# Patient Record
Sex: Female | Born: 1990
Health system: Southern US, Community
[De-identification: ages and names within clinical notes are randomized; demographics above are authoritative.]

## PROBLEM LIST (undated history)

## (undated) DIAGNOSIS — F329 Major depressive disorder, single episode, unspecified: Secondary | ICD-10-CM

## (undated) DIAGNOSIS — G8929 Other chronic pain: Secondary | ICD-10-CM

## (undated) DIAGNOSIS — F419 Anxiety disorder, unspecified: Secondary | ICD-10-CM

## (undated) DIAGNOSIS — E079 Disorder of thyroid, unspecified: Secondary | ICD-10-CM

## (undated) DIAGNOSIS — F32A Depression, unspecified: Secondary | ICD-10-CM

## (undated) DIAGNOSIS — R51 Headache: Secondary | ICD-10-CM

## (undated) HISTORY — PX: TONSILLECTOMY: SUR1361

## (undated) HISTORY — DX: Depression, unspecified: F32.A

## (undated) HISTORY — DX: Disorder of thyroid, unspecified: E07.9

## (undated) HISTORY — DX: Anxiety disorder, unspecified: F41.9

## (undated) HISTORY — DX: Major depressive disorder, single episode, unspecified: F32.9

## (undated) HISTORY — DX: Headache: R51

## (undated) HISTORY — DX: Headache, unspecified: G89.29

---

## 2012-10-21 ENCOUNTER — Ambulatory Visit (HOSPITAL_COMMUNITY)
Admission: RE | Admit: 2012-10-21 | Discharge: 2012-10-21 | Disposition: A | Discharge status: Home / Self Care | Payer: BC Managed Care – PPO | Source: Ambulatory Visit | Attending: Internal Medicine | Admitting: Internal Medicine

## 2012-10-21 ENCOUNTER — Other Ambulatory Visit (HOSPITAL_COMMUNITY): Payer: Self-pay | Admitting: Internal Medicine

## 2012-10-21 DIAGNOSIS — S139XXA Sprain of joints and ligaments of unspecified parts of neck, initial encounter: Secondary | ICD-10-CM

## 2012-10-21 DIAGNOSIS — R51 Headache: Secondary | ICD-10-CM

## 2012-11-21 ENCOUNTER — Other Ambulatory Visit: Payer: Self-pay | Admitting: Internal Medicine

## 2012-11-21 LAB — BASIC METABOLIC PANEL WITH GFR
BUN: 12 mg/dL (ref 6–23)
CO2: 26 mEq/L (ref 19–32)
Calcium: 9.5 mg/dL (ref 8.4–10.5)
Chloride: 103 mEq/L (ref 96–112)
GFR, Est Non African American: >89 mL/min
Glucose, Bld: 85 mg/dL (ref 70–99)
Potassium: 4 mEq/L (ref 3.5–5.3)

## 2012-11-21 LAB — CBC WITH DIFFERENTIAL/PLATELET
Basophils Absolute: 0 10*3/uL (ref 0.0–0.1)
Basophils Relative: 0 % (ref 0–1)
HCT: 43 % (ref 36.0–46.0)
Hemoglobin: 15.2 g/dL — ABNORMAL HIGH (ref 12.0–15.0)
MCHC: 35.3 g/dL (ref 30.0–36.0)
Monocytes Absolute: 0.4 10*3/uL (ref 0.1–1.0)
Monocytes Relative: 5 % (ref 3–12)
Neutro Abs: 4.8 10*3/uL (ref 1.7–7.7)
RDW: 12.9 % (ref 11.5–15.5)

## 2012-11-22 LAB — VITAMIN D 25 HYDROXY (VIT D DEFICIENCY, FRACTURES): Vit D, 25-Hydroxy: 81 ng/mL (ref 30–89)

## 2012-11-25 ENCOUNTER — Other Ambulatory Visit: Payer: Self-pay | Admitting: Physician Assistant

## 2012-11-25 DIAGNOSIS — E049 Nontoxic goiter, unspecified: Secondary | ICD-10-CM

## 2012-11-25 LAB — FOLATE RBC: RBC Folate: 674 ng/mL (ref >=366)

## 2012-11-27 ENCOUNTER — Other Ambulatory Visit: Payer: BC Managed Care – PPO

## 2012-12-08 ENCOUNTER — Other Ambulatory Visit: Payer: BC Managed Care – PPO

## 2012-12-11 ENCOUNTER — Ambulatory Visit
Admission: RE | Admit: 2012-12-11 | Discharge: 2012-12-11 | Disposition: A | Discharge status: Home / Self Care | Payer: BC Managed Care – PPO | Source: Ambulatory Visit | Attending: Physician Assistant | Admitting: Physician Assistant

## 2012-12-11 DIAGNOSIS — E049 Nontoxic goiter, unspecified: Secondary | ICD-10-CM

## 2013-01-19 ENCOUNTER — Ambulatory Visit: Payer: Self-pay

## 2013-01-20 ENCOUNTER — Ambulatory Visit (INDEPENDENT_AMBULATORY_CARE_PROVIDER_SITE_OTHER): Payer: BC Managed Care – PPO | Admitting: *Deleted

## 2013-01-20 DIAGNOSIS — Z23 Encounter for immunization: Secondary | ICD-10-CM

## 2013-01-20 NOTE — Progress Notes (Signed)
Patient ID: Adriana Smith, female   DOB: 12-01-1990, 22 y.o.   MRN: [id number] Patient presented for #2 Gardasil vaccination today.  Right deltoid cleaned with alcohol and administered 0.67ml prefilled 1 dose vial HPV Quadrivalent Gardasil.  Patient tolerated well and will schedule #3 dose at checkout today.

## 2013-04-15 ENCOUNTER — Ambulatory Visit (INDEPENDENT_AMBULATORY_CARE_PROVIDER_SITE_OTHER): Payer: BC Managed Care – PPO | Admitting: Family Medicine

## 2013-04-15 VITALS — BP 110/62 | HR 65 | Temp 98.1°F | Resp 16 | Ht 62.0 in | Wt 111.4 lb

## 2013-04-15 DIAGNOSIS — R197 Diarrhea, unspecified: Secondary | ICD-10-CM

## 2013-04-15 DIAGNOSIS — R1032 Left lower quadrant pain: Secondary | ICD-10-CM

## 2013-04-15 LAB — POCT CBC
GRANULOCYTE PERCENT: 63.8 % (ref 37–80)
HEMATOCRIT: 37 % — AB (ref 37.7–47.9)
Hemoglobin: 12.1 g/dL — AB (ref 12.2–16.2)
LYMPH, POC: 1.8 (ref 0.6–3.4)
MCH, POC: 30.2 pg (ref 27–31.2)
MCHC: 32.7 g/dL (ref 31.8–35.4)
MCV: 92.2 fL (ref 80–97)
MID (CBC): 0.4 (ref 0–0.9)
MPV: 9.7 fL (ref 0–99.8)
PLATELET COUNT, POC: 255 10*3/uL (ref 142–424)
POC GRANULOCYTE: 3.8 (ref 2–6.9)
POC LYMPH %: 30 % (ref 10–50)
POC MID %: 6.2 %M (ref 0–12)
RBC: 4.01 M/uL — AB (ref 4.04–5.48)
RDW, POC: 13.3 %
WBC: 5.9 10*3/uL (ref 4.6–10.2)

## 2013-04-15 LAB — POCT SEDIMENTATION RATE: POCT SED RATE: 6 mm/hr (ref 0–22)

## 2013-04-15 NOTE — Patient Instructions (Signed)
Diarrhea Diarrhea is frequent loose and watery bowel movements. It can cause you to feel weak and dehydrated. Dehydration can cause you to become tired and thirsty, have a dry mouth, and have decreased urination that often is dark yellow. Diarrhea is a sign of another problem, most often an infection that will not last long. In most cases, diarrhea typically lasts 2 3 days. However, it can last longer if it is a sign of something more serious. It is important to treat your diarrhea as directed by your caregive to lessen or prevent future episodes of diarrhea. CAUSES  Some common causes include:  Gastrointestinal infections caused by viruses, bacteria, or parasites.  Food poisoning or food allergies.  Certain medicines, such as antibiotics, chemotherapy, and laxatives.  Artificial sweeteners and fructose.  Digestive disorders. HOME CARE INSTRUCTIONS  Ensure adequate fluid intake (hydration): have 1 cup (8 oz) of fluid for each diarrhea episode. Avoid fluids that contain simple sugars or sports drinks, fruit juices, whole milk products, and sodas. Your urine should be clear or pale yellow if you are drinking enough fluids. Hydrate with an oral rehydration solution that you can purchase at pharmacies, retail stores, and online. You can prepare an oral rehydration solution at home by mixing the following ingredients together:    tsp table salt.   tsp baking soda.   tsp salt substitute containing potassium chloride.  1  tablespoons sugar.  1 L (34 oz) of water.  Certain foods and beverages may increase the speed at which food moves through the gastrointestinal (GI) tract. These foods and beverages should be avoided and include:  Caffeinated and alcoholic beverages.  High-fiber foods, such as raw fruits and vegetables, nuts, seeds, and whole grain breads and cereals.  Foods and beverages sweetened with sugar alcohols, such as xylitol, sorbitol, and mannitol.  Some foods may be well  tolerated and may help thicken stool including:  Starchy foods, such as rice, toast, pasta, low-sugar cereal, oatmeal, grits, baked potatoes, crackers, and bagels.  Bananas.  Applesauce.  Add probiotic-rich foods to help increase healthy bacteria in the GI tract, such as yogurt and fermented milk products.  Wash your hands well after each diarrhea episode.  Only take over-the-counter or prescription medicines as directed by your caregiver.  Take a warm bath to relieve any burning or pain from frequent diarrhea episodes. SEEK IMMEDIATE MEDICAL CARE IF:   You are unable to keep fluids down.  You have persistent vomiting.  You have blood in your stool, or your stools are black and tarry.  You do not urinate in 6 8 hours, or there is only a small amount of very dark urine.  You have abdominal pain that increases or localizes.  You have weakness, dizziness, confusion, or lightheadedness.  You have a severe headache.  Your diarrhea gets worse or does not get better.  You have a fever or persistent symptoms for more than 2 3 days.  You have a fever and your symptoms suddenly get worse. MAKE SURE YOU:   Understand these instructions.  Will watch your condition.  Will get help right away if you are not doing well or get worse. Document Released: 12/29/2001 Document Revised: 12/26/2011 Document Reviewed: 09/16/2011 [employer] Patient Information 2014 [employer].  

## 2013-04-15 NOTE — Progress Notes (Signed)
Subjective:    Patient ID: Adriana Smith, female    DOB: [birth year], 23 y.o.   MRN: [id number]  04/15/2013  Diarrhea, Abdominal Pain and Gardasil   HPI This 23 y.o. female presents for evaluation diarrhea.  Onset of diarrhea three weeks ago.  Had menses; usually has stomach upset with menses.  Had more stomach upset with menses. Has suffered with diarrhea after each meal for three weeks.  Has suffered with lower abdominal pain for years; chronic issue.  Thought abdominal pain was OCP so lowered OCP dose; pain improved but persisted.  Decreased appetite for past three weeks.  Has lost seven pounds since 02/2013.  Really bad headache; constantly fatigued/tired.  Admits to stress; switching jobs.  Pain has worsened with stool changes.  Awoke at 3:00am due to diarrhea; also happened last week.  Chills last night.  No fever/sweats.  No nausea, vomiting. +appetite but early satiety.  Stomach churns all the time.  No antibiotics.  No recent traveling; no camping; +sick contacts at work.  Admissions counselor for high school.  Traveled two weeks ago to [place] and [place].  No new foods while gone.  Having 4 stools per day; formed stool but very loose; non-bloody; +mucous.  No Crohn's disease or Ulcerative Colitis in family hx.  Worried about gluten allergy.  Mother with similar symptoms at this age but she grew out of it; occurred in 53s.  Thyroid abnormality in 10/2012; took B12 supplement with improvement in thyroid levels; no follow-up with that physician.   Review of Systems  Constitutional: Positive for chills, appetite change, fatigue and unexpected weight change. Negative for fever and diaphoresis.  Gastrointestinal: Positive for nausea, abdominal pain and diarrhea. Negative for vomiting, constipation, blood in stool, abdominal distention and anal bleeding.  Genitourinary: Negative for dysuria, urgency and frequency.  Neurological: Positive for headaches. Negative for dizziness and  light-headedness.  Psychiatric/Behavioral: The patient is nervous/anxious.     Past Medical History  Diagnosis Date  . Depression   . Anxiety   . Thyroid disease   . Chronic headaches    No Known Allergies Current Outpatient Prescriptions  Medication Sig Dispense Refill  . norgestrel-ethinyl estradiol (LO/OVRAL,CRYSELLE) 0.3-30 MG-MCG tablet Take 1 tablet by mouth daily.       No current facility-administered medications for this visit.       Objective:    BP 110/62  Pulse 65  Temp(Src) 98.1 F (36.7 C) (Oral)  Resp 16  Ht 5' 2" (1.575 m)  Wt 111 lb 6.4 oz (50.531 kg)  BMI 20.37 kg/m2  SpO2 100%  LMP 04/08/2013 Physical Exam  Constitutional: She is oriented to person, place, and time. She appears well-developed and well-nourished. No distress.  HENT:  Head: Normocephalic and atraumatic.  Right Ear: External ear normal.  Left Ear: External ear normal.  Nose: Nose normal.  Mouth/Throat: Oropharynx is clear and moist.  Eyes: Conjunctivae and EOM are normal. Pupils are equal, round, and reactive to light.  Neck: Normal range of motion. Neck supple. Carotid bruit is not present. No thyromegaly present.  Cardiovascular: Normal rate, regular rhythm, normal heart sounds and intact distal pulses.  Exam reveals no gallop and no friction rub.   No murmur heard. Pulmonary/Chest: Effort normal and breath sounds normal. She has no wheezes. She has no rales.  Abdominal: Soft. Bowel sounds are normal. She exhibits no distension and no mass. There is tenderness. There is no rebound and no guarding.  Diffuse TTP; no g/r.  Lymphadenopathy:  She has no cervical adenopathy.  Neurological: She is alert and oriented to person, place, and time. No cranial nerve deficit.  Skin: Skin is warm and dry. No rash noted. She is not diaphoretic. No erythema. No pallor.  Psychiatric: She has a normal mood and affect. Her behavior is normal.   Results for orders placed in visit on 04/15/13  POCT  CBC      Result Value Ref Range   WBC 5.9  4.6 - 10.2 K/uL   Lymph, poc 1.8  0.6 - 3.4   POC LYMPH PERCENT 30.0  10 - 50 %L   MID (cbc) 0.4  0 - 0.9   POC MID % 6.2  0 - 12 %M   POC Granulocyte 3.8  2 - 6.9   Granulocyte percent 63.8  37 - 80 %G   RBC 4.01 (*) 4.04 - 5.48 M/uL   Hemoglobin 12.1 (*) 12.2 - 16.2 g/dL   HCT, POC 37.0 (*) 37.7 - 47.9 %   MCV 92.2  80 - 97 fL   MCH, POC 30.2  27 - 31.2 pg   MCHC 32.7  31.8 - 35.4 g/dL   RDW, POC 13.3     Platelet Count, POC 255  142 - 424 K/uL   MPV 9.7  0 - 99.8 fL       Assessment & Plan:  Diarrhea - Plan: POCT CBC, POCT SEDIMENTATION RATE, Comprehensive metabolic panel, TSH, Ova and parasite examination, Clostridium difficile EIA, Stool culture  Abdominal pain, left lower quadrant - Plan: POCT CBC, POCT SEDIMENTATION RATE, Comprehensive metabolic panel, TSH, Ova and parasite examination, Clostridium difficile EIA, Stool culture   1. Diarrhea;  New; onset three weeks ago; obtain labs including CBC, CMET, stool culture, C. Difficile, O&P.  Obtain ESR.  Seven pound weight loss with acute illness. 2.  Abdominal pain LLQ and diffuse:  New. Chronic issue for patient with recent worsening with diarrhea.  Concerning for IBS, Celiac disease, Crohn's disease, or UC.  Refer to GI.   Meds ordered this encounter  Medications  . norgestrel-ethinyl estradiol (LO/OVRAL,CRYSELLE) 0.3-30 MG-MCG tablet    Sig: Take 1 tablet by mouth daily.    No Follow-up on file.    Reginia Forts, M.D.  [facility] [address] [place], [place]  [address] [phone] phone [phone] fax

## 2013-04-16 ENCOUNTER — Ambulatory Visit: Payer: Self-pay

## 2013-04-16 LAB — COMPREHENSIVE METABOLIC PANEL
ALK PHOS: 28 U/L — AB (ref 39–117)
ALT: 10 U/L (ref 0–35)
AST: 16 U/L (ref 0–37)
Albumin: 4.5 g/dL (ref 3.5–5.2)
BILIRUBIN TOTAL: 0.4 mg/dL (ref 0.2–1.2)
BUN: 8 mg/dL (ref 6–23)
CO2: 23 meq/L (ref 19–32)
CREATININE: 0.73 mg/dL (ref 0.50–1.10)
Calcium: 8.6 mg/dL (ref 8.4–10.5)
Chloride: 104 mEq/L (ref 96–112)
Glucose, Bld: 72 mg/dL (ref 70–99)
Potassium: 3.9 mEq/L (ref 3.5–5.3)
SODIUM: 137 meq/L (ref 135–145)
Total Protein: 6.9 g/dL (ref 6.0–8.3)

## 2013-04-16 LAB — TSH: TSH: 0.917 u[IU]/mL (ref 0.350–4.500)

## 2013-04-18 LAB — CLOSTRIDIUM DIFFICILE EIA: CDIFTX: NEGATIVE

## 2013-04-21 LAB — STOOL CULTURE

## 2013-05-21 ENCOUNTER — Ambulatory Visit: Payer: Self-pay | Admitting: Physician Assistant

## 2013-05-21 ENCOUNTER — Telehealth: Payer: Self-pay

## 2013-05-21 VITALS — BP 118/60 | HR 64 | Temp 98.4°F | Resp 18 | Ht 62.0 in | Wt 112.0 lb

## 2013-05-21 DIAGNOSIS — N390 Urinary tract infection, site not specified: Secondary | ICD-10-CM

## 2013-05-21 DIAGNOSIS — R300 Dysuria: Secondary | ICD-10-CM

## 2013-05-21 LAB — POCT URINALYSIS DIPSTICK
Bilirubin, UA: NEGATIVE
GLUCOSE UA: NEGATIVE
KETONES UA: NEGATIVE
Nitrite, UA: NEGATIVE
Protein, UA: NEGATIVE
SPEC GRAV UA: 1.015
UROBILINOGEN UA: 0.2
pH, UA: 6.5

## 2013-05-21 LAB — POCT UA - MICROSCOPIC ONLY
CRYSTALS, UR, HPF, POC: NEGATIVE
Casts, Ur, LPF, POC: NEGATIVE
MUCUS UA: NEGATIVE
Yeast, UA: NEGATIVE

## 2013-05-21 MED ORDER — CIPROFLOXACIN HCL 250 MG PO TABS: 250.0000 mg | ORAL_TABLET | Freq: Two times a day (BID) | ORAL | Status: DC

## 2013-05-21 NOTE — Progress Notes (Signed)
   Subjective:    Patient ID: Adriana RimaChelsea Brumbach, female    DOB: [birth year], 23 y.o.   MRN: [id number]  HPI 23 year old female presents for evaluation of 3 day history of dysuria, suprapubic abdominal pain, and hematuria.  States yesterday she noticed that she was "peeing blood." At first she thought it may be her menses starting, but she used a tampon and continued to notice blood in her urine.  Admits to lower abdominal pain and discomfort with urination.  Hx of a UTI in the past hat was "years" ago.  Also complains of slight vaginal discharge but admits this is not different from her "norm."  Has appt with her GYN next month.  No specific concerns about STI's today. Denies fever, chills, nausea, or vomiting.     Review of Systems  Constitutional: Negative for fever and chills.  Gastrointestinal: Positive for abdominal pain. Negative for nausea and vomiting.  Genitourinary: Positive for dysuria, frequency, hematuria and vaginal discharge.  Neurological: Negative for dizziness and headaches.       Objective:   Physical Exam  Constitutional: She is oriented to person, place, and time. She appears well-developed and well-nourished.  HENT:  Head: Normocephalic and atraumatic.  Right Ear: External ear normal.  Left Ear: External ear normal.  Eyes: Conjunctivae are normal.  Neck: Normal range of motion.  Cardiovascular: Normal rate, regular rhythm and normal heart sounds.   Pulmonary/Chest: Effort normal and breath sounds normal.  Abdominal: Soft. Bowel sounds are normal. There is tenderness in the suprapubic area. There is no CVA tenderness.  Neurological: She is alert and oriented to person, place, and time.  Psychiatric: She has a normal mood and affect. Her behavior is normal. Judgment and thought content normal.    Results for orders placed in visit on 05/21/13  POCT URINALYSIS DIPSTICK      Result Value Ref Range   Color, UA yellow     Clarity, UA clear     Glucose, UA neg     Bilirubin, UA neg     Ketones, UA neg     Spec Grav, UA 1.015     Blood, UA trace     pH, UA 6.5     Protein, UA neg     Urobilinogen, UA 0.2     Nitrite, UA neg     Leukocytes, UA Trace    POCT UA - MICROSCOPIC ONLY      Result Value Ref Range   WBC, Ur, HPF, POC 2-4     RBC, urine, microscopic 0-2     Bacteria, U Microscopic trace     Mucus, UA neg     Epithelial cells, urine per micros 1-3     Crystals, Ur, HPF, POC neg     Casts, Ur, LPF, POC neg     Yeast, UA neg           Assessment & Plan:  UTI (urinary tract infection) - Plan: ciprofloxacin (CIPRO) 250 MG tablet, Urine culture  Dysuria - Plan: POCT urinalysis dipstick, POCT UA - Microscopic Only, ciprofloxacin (CIPRO) 250 MG tablet, Urine culture  Urine culture pending Will treat as UTI with Cipro 250 mg bid x 5 days Push fluids Patient declined STI screening today secondary to cost. Encouraged her to f/u here or with GYN if symptoms fail to improve or vaginal sx's worsening.

## 2013-05-21 NOTE — Telephone Encounter (Signed)
Dr. Katrinka BlazingSmith    Patient thinks she has a UTI,  No insurance this week, will have next week.   Also has blood in urine heavy yesterday, not so much today.    [phone]

## 2013-05-21 NOTE — Telephone Encounter (Signed)
Spoke to patient.  She says that she woke up about 1am and she was "peeing blood"  She states that today is not having blood in her urine but is burning while she pees.  She says that she does not have insurance because she has changed jobs.  Advised patient that we do give a discount for patients without insurance.  Told her that she needs to be seen.  She said she will come in after work.

## 2013-05-23 LAB — URINE CULTURE: Colony Count: >=100000

## 2013-05-25 ENCOUNTER — Encounter: Payer: Self-pay | Admitting: Family Medicine

## 2013-05-27 ENCOUNTER — Telehealth: Payer: Self-pay

## 2013-05-27 NOTE — Telephone Encounter (Signed)
Reviewed labs with patient,  patient states understanding.

## 2013-10-08 ENCOUNTER — Encounter: Payer: Self-pay | Admitting: Physician Assistant

## 2013-10-08 DIAGNOSIS — A09 Infectious gastroenteritis and colitis, unspecified: Secondary | ICD-10-CM | POA: Insufficient documentation

## 2014-11-03 IMAGING — CR DG CERVICAL SPINE COMPLETE 4+V
5 series · 5 of 5 positions shown · non-contrast
Comparison: None

CLINICAL DATA: Neck sprain.

CERVICAL SPINE - COMPLETE 4+ VIEW

[view not recorded (1 of 5)]
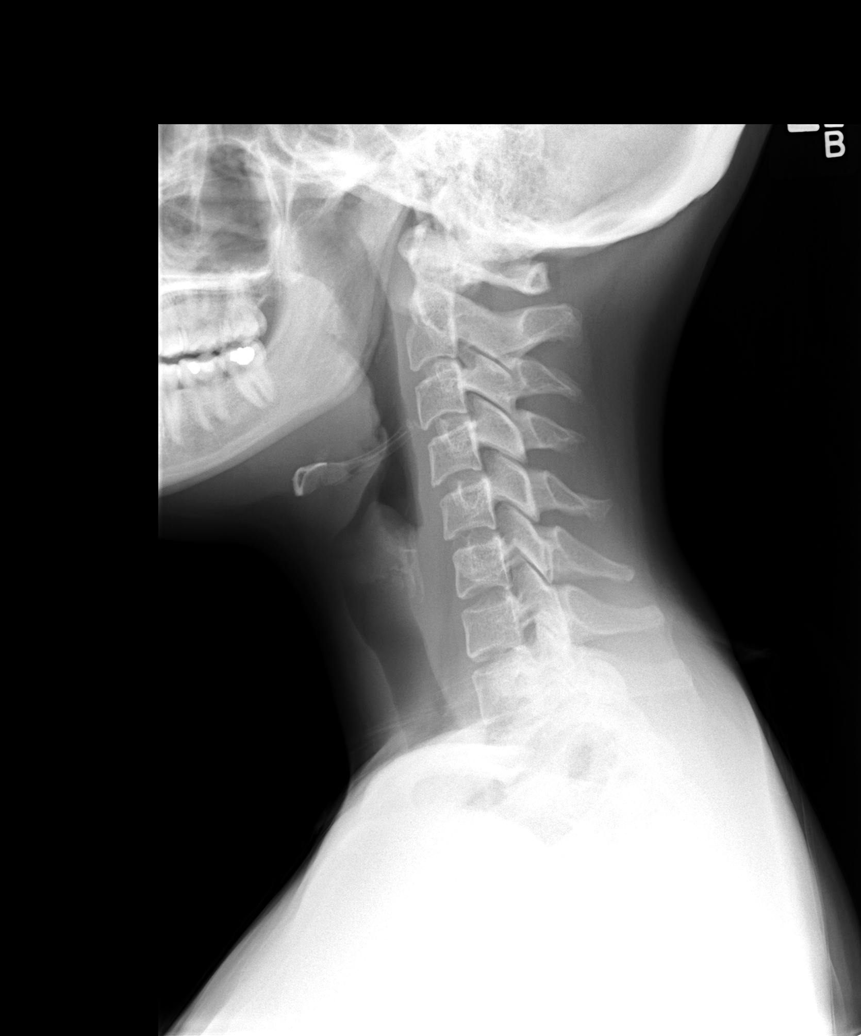

[view not recorded (2 of 5)]
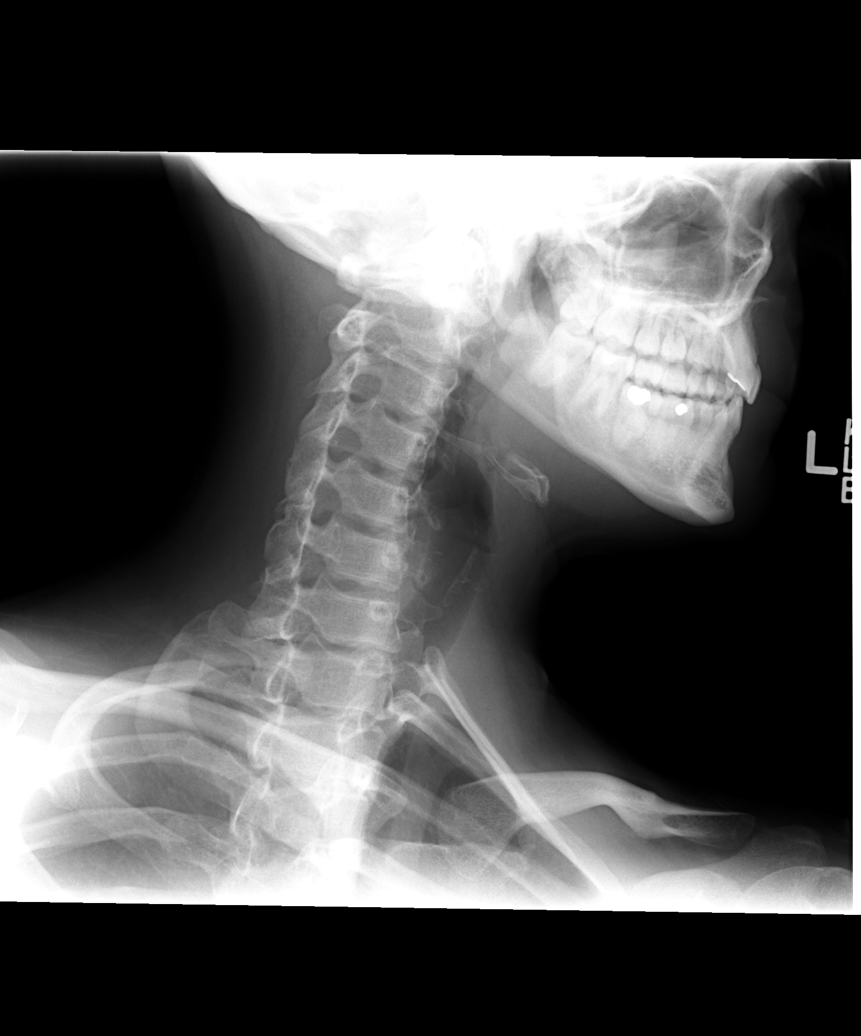

[view not recorded (3 of 5)]
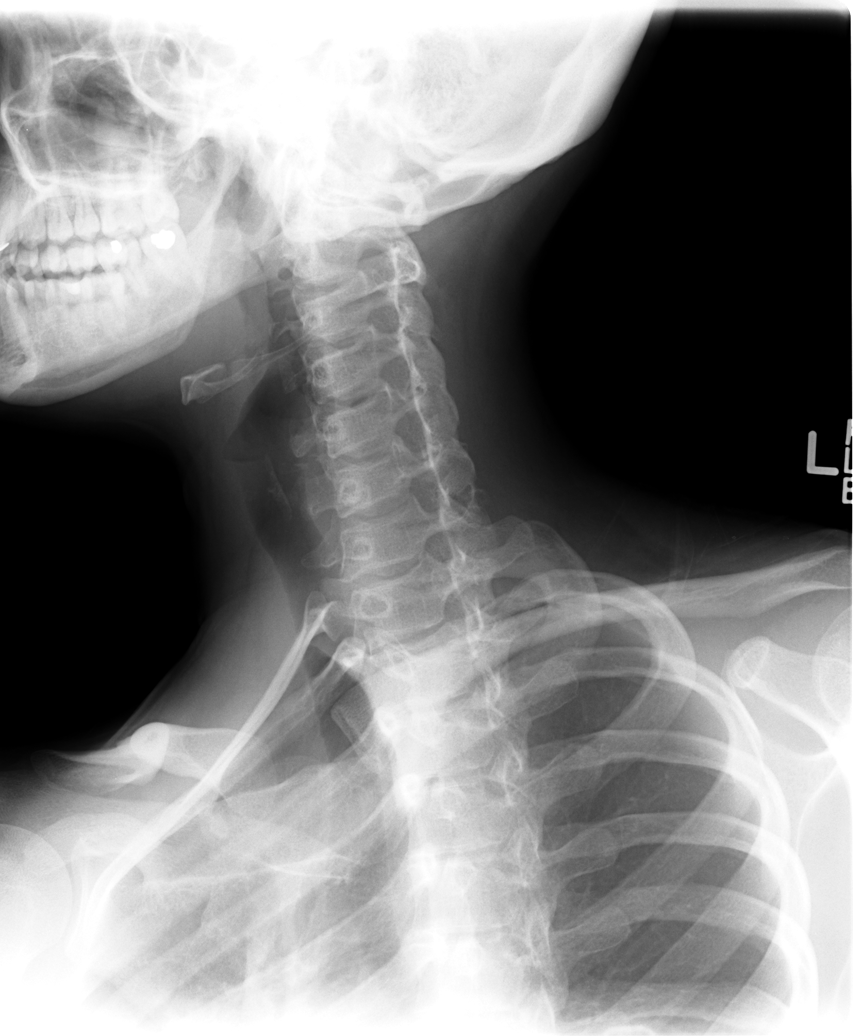

[view not recorded (4 of 5)]
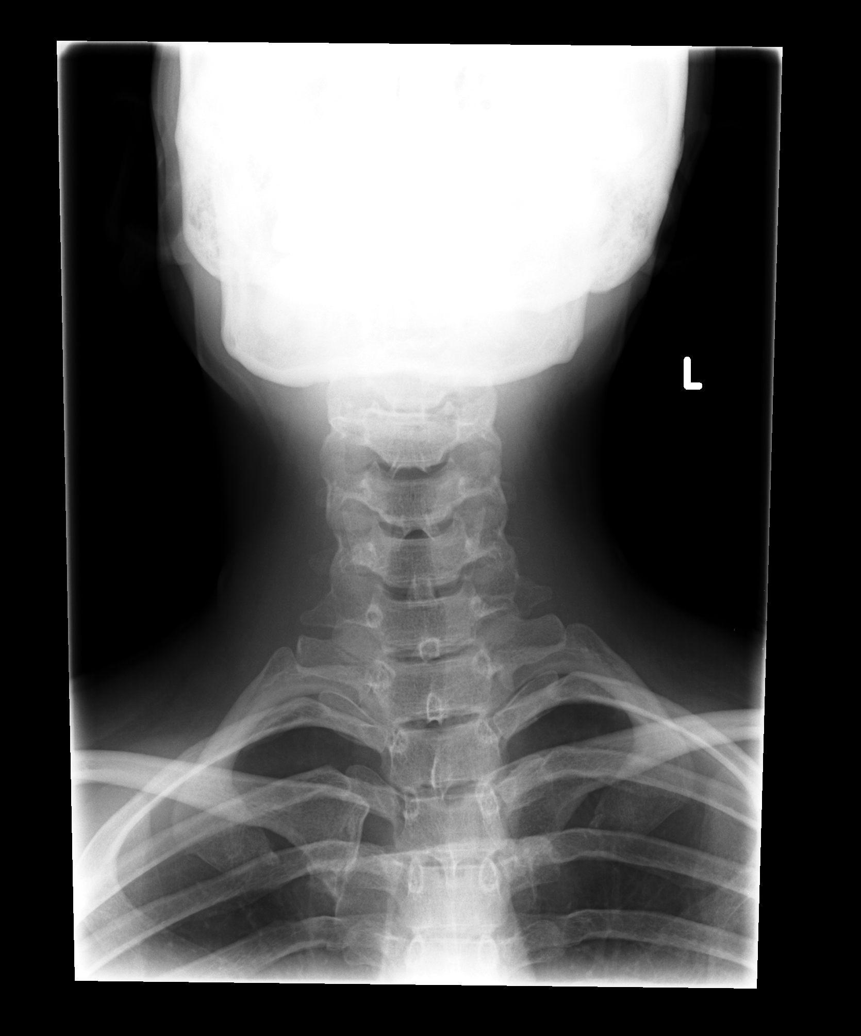

[view not recorded (5 of 5)]
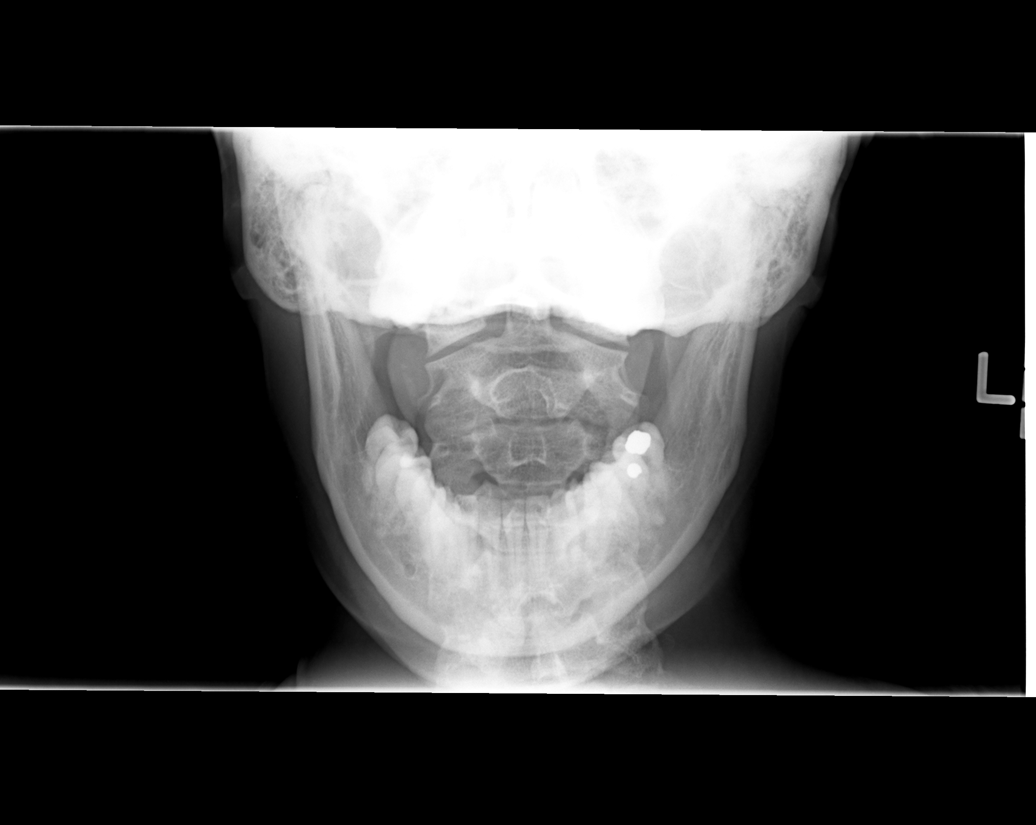

[5 of 5 positions shown; findings below may reference images not displayed]

FINDINGS: Visualization through T1 on lateral view.  Relative
straightening of the normal cervical lordosis.  No evidence for
static listhesis.  Prevertebral soft tissues unremarkable.  Normal
predental space.  No significant osseous neural foraminal
narrowing.  Lateral masses articulate appropriately with the dens.
IMPRESSION: No acute osseous abnormality.

## 2014-11-03 IMAGING — CR DG THORACIC SPINE 3V
2 series · 2 of 2 positions shown · non-contrast
Comparison: None

CLINICAL DATA: Headache.

THORACIC SPINE - 2 VIEW + SWIMMERS

[view not recorded (1 of 2)]
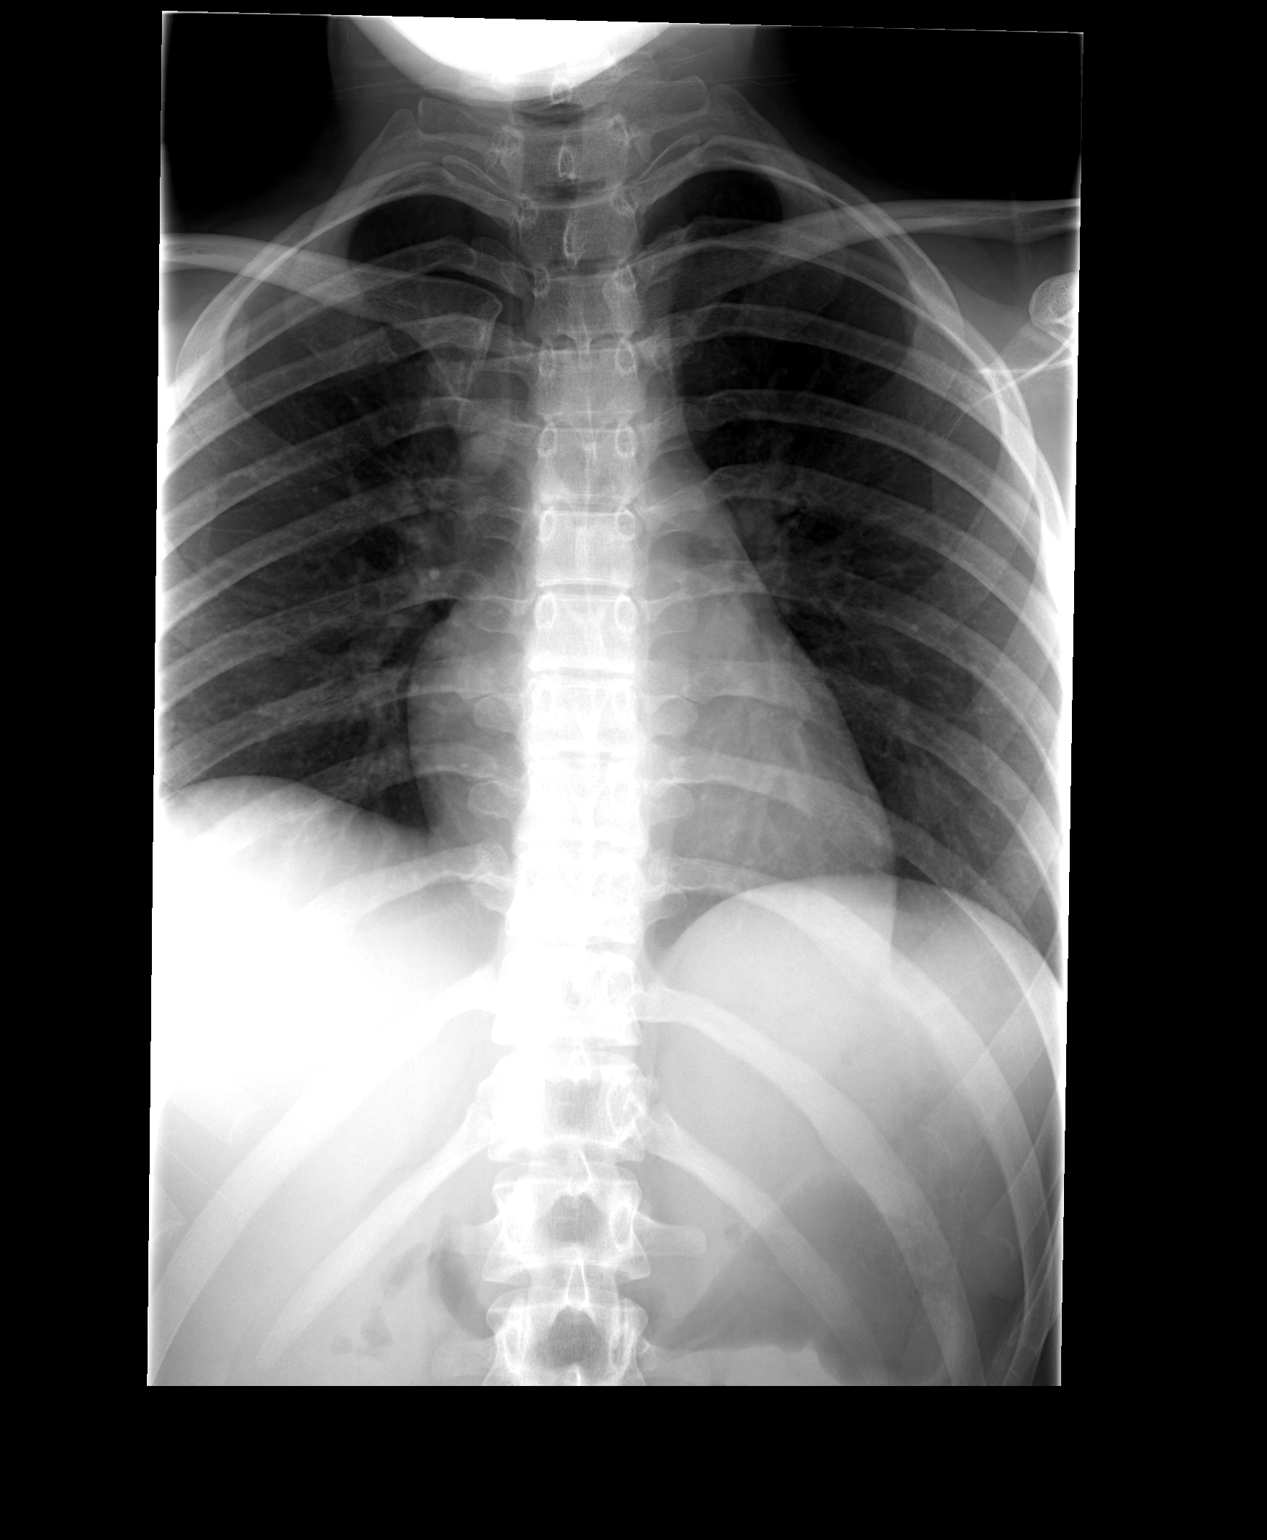

[view not recorded (2 of 2)]
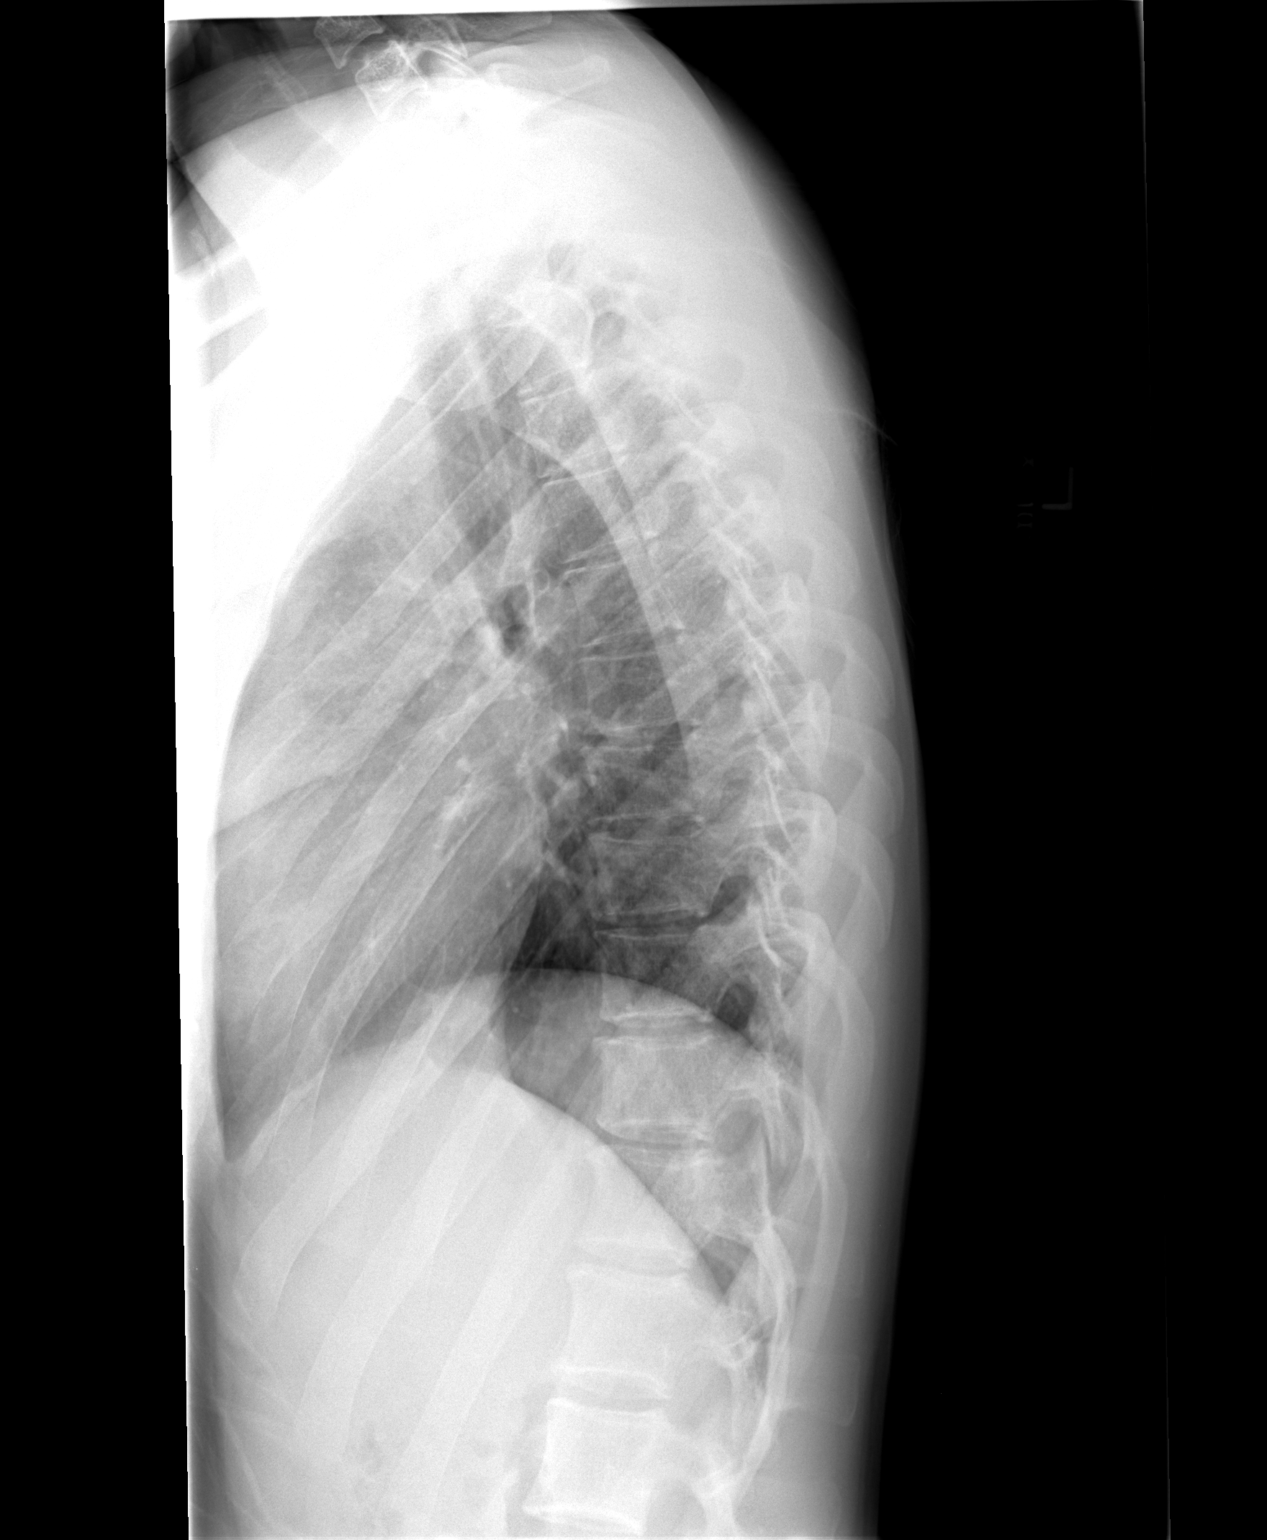

[2 of 2 positions shown; findings below may reference images not displayed]

FINDINGS: Normal anatomic alignment.  No evidence for static
listhesis.  Normal cardiac and mediastinal contours.  Visualized
lungs are clear.
IMPRESSION: No acute osseous abnormality involving the thoracic spine.

## 2015-08-08 DIAGNOSIS — D508 Other iron deficiency anemias: Secondary | ICD-10-CM | POA: Diagnosis not present

## 2015-08-08 DIAGNOSIS — Z0000 Encounter for general adult medical examination without abnormal findings: Secondary | ICD-10-CM | POA: Diagnosis not present

## 2015-12-12 DIAGNOSIS — S161XXA Strain of muscle, fascia and tendon at neck level, initial encounter: Secondary | ICD-10-CM | POA: Diagnosis not present

## 2015-12-12 DIAGNOSIS — J45909 Unspecified asthma, uncomplicated: Secondary | ICD-10-CM | POA: Diagnosis not present

## 2015-12-12 DIAGNOSIS — J069 Acute upper respiratory infection, unspecified: Secondary | ICD-10-CM | POA: Diagnosis not present

## 2016-02-21 DIAGNOSIS — Z01419 Encounter for gynecological examination (general) (routine) without abnormal findings: Secondary | ICD-10-CM | POA: Diagnosis not present

## 2016-02-21 DIAGNOSIS — Z681 Body mass index (BMI) 19 or less, adult: Secondary | ICD-10-CM | POA: Diagnosis not present

## 2016-02-21 DIAGNOSIS — Z124 Encounter for screening for malignant neoplasm of cervix: Secondary | ICD-10-CM | POA: Diagnosis not present

## 2016-04-25 DIAGNOSIS — Z0100 Encounter for examination of eyes and vision without abnormal findings: Secondary | ICD-10-CM | POA: Diagnosis not present

## 2016-06-19 DIAGNOSIS — J387 Other diseases of larynx: Secondary | ICD-10-CM | POA: Diagnosis not present

## 2016-06-19 DIAGNOSIS — J0410 Acute tracheitis without obstruction: Secondary | ICD-10-CM | POA: Diagnosis not present

## 2016-10-22 DIAGNOSIS — S060X9S Concussion with loss of consciousness of unspecified duration, sequela: Secondary | ICD-10-CM | POA: Diagnosis not present

## 2016-10-22 DIAGNOSIS — S0990XA Unspecified injury of head, initial encounter: Secondary | ICD-10-CM | POA: Diagnosis not present

## 2016-10-26 ENCOUNTER — Other Ambulatory Visit: Payer: Self-pay | Admitting: Family Medicine

## 2016-10-26 DIAGNOSIS — S0990XD Unspecified injury of head, subsequent encounter: Secondary | ICD-10-CM

## 2016-10-31 ENCOUNTER — Other Ambulatory Visit: Payer: Self-pay

## 2016-12-10 DIAGNOSIS — Z681 Body mass index (BMI) 19 or less, adult: Secondary | ICD-10-CM | POA: Diagnosis not present

## 2016-12-10 DIAGNOSIS — Z3040 Encounter for surveillance of contraceptives, unspecified: Secondary | ICD-10-CM | POA: Diagnosis not present

## 2017-05-03 DIAGNOSIS — F418 Other specified anxiety disorders: Secondary | ICD-10-CM | POA: Diagnosis not present

## 2017-05-03 DIAGNOSIS — Z0001 Encounter for general adult medical examination with abnormal findings: Secondary | ICD-10-CM | POA: Diagnosis not present

## 2017-05-03 DIAGNOSIS — Z23 Encounter for immunization: Secondary | ICD-10-CM | POA: Diagnosis not present

## 2017-05-03 DIAGNOSIS — M25562 Pain in left knee: Secondary | ICD-10-CM | POA: Diagnosis not present

## 2017-05-03 DIAGNOSIS — M545 Low back pain: Secondary | ICD-10-CM | POA: Diagnosis not present

## 2017-05-03 DIAGNOSIS — Z13220 Encounter for screening for lipoid disorders: Secondary | ICD-10-CM | POA: Diagnosis not present

## 2017-05-09 DIAGNOSIS — Z124 Encounter for screening for malignant neoplasm of cervix: Secondary | ICD-10-CM | POA: Diagnosis not present

## 2017-05-09 DIAGNOSIS — Z3202 Encounter for pregnancy test, result negative: Secondary | ICD-10-CM | POA: Diagnosis not present

## 2017-05-09 DIAGNOSIS — Z01419 Encounter for gynecological examination (general) (routine) without abnormal findings: Secondary | ICD-10-CM | POA: Diagnosis not present

## 2017-05-09 DIAGNOSIS — Z681 Body mass index (BMI) 19 or less, adult: Secondary | ICD-10-CM | POA: Diagnosis not present

## 2017-06-11 DIAGNOSIS — F418 Other specified anxiety disorders: Secondary | ICD-10-CM | POA: Diagnosis not present

## 2017-06-12 DIAGNOSIS — F419 Anxiety disorder, unspecified: Secondary | ICD-10-CM | POA: Diagnosis not present

## 2017-06-12 DIAGNOSIS — F329 Major depressive disorder, single episode, unspecified: Secondary | ICD-10-CM | POA: Diagnosis not present

## 2017-06-13 DIAGNOSIS — F341 Dysthymic disorder: Secondary | ICD-10-CM | POA: Diagnosis not present

## 2017-06-18 DIAGNOSIS — F341 Dysthymic disorder: Secondary | ICD-10-CM | POA: Diagnosis not present

## 2017-06-25 DIAGNOSIS — F341 Dysthymic disorder: Secondary | ICD-10-CM | POA: Diagnosis not present

## 2017-06-26 DIAGNOSIS — F329 Major depressive disorder, single episode, unspecified: Secondary | ICD-10-CM | POA: Diagnosis not present

## 2017-06-26 DIAGNOSIS — F419 Anxiety disorder, unspecified: Secondary | ICD-10-CM | POA: Diagnosis not present

## 2017-06-28 DIAGNOSIS — H66002 Acute suppurative otitis media without spontaneous rupture of ear drum, left ear: Secondary | ICD-10-CM | POA: Diagnosis not present

## 2017-07-09 DIAGNOSIS — F341 Dysthymic disorder: Secondary | ICD-10-CM | POA: Diagnosis not present

## 2017-07-16 DIAGNOSIS — F341 Dysthymic disorder: Secondary | ICD-10-CM | POA: Diagnosis not present

## 2017-07-24 DIAGNOSIS — F419 Anxiety disorder, unspecified: Secondary | ICD-10-CM | POA: Diagnosis not present

## 2017-07-24 DIAGNOSIS — F329 Major depressive disorder, single episode, unspecified: Secondary | ICD-10-CM | POA: Diagnosis not present

## 2017-07-31 DIAGNOSIS — F341 Dysthymic disorder: Secondary | ICD-10-CM | POA: Diagnosis not present

## 2017-08-22 DIAGNOSIS — F341 Dysthymic disorder: Secondary | ICD-10-CM | POA: Diagnosis not present

## 2017-08-29 DIAGNOSIS — F341 Dysthymic disorder: Secondary | ICD-10-CM | POA: Diagnosis not present

## 2017-09-05 DIAGNOSIS — F341 Dysthymic disorder: Secondary | ICD-10-CM | POA: Diagnosis not present

## 2017-09-12 DIAGNOSIS — F341 Dysthymic disorder: Secondary | ICD-10-CM | POA: Diagnosis not present

## 2017-09-26 DIAGNOSIS — F341 Dysthymic disorder: Secondary | ICD-10-CM | POA: Diagnosis not present

## 2017-10-10 DIAGNOSIS — F341 Dysthymic disorder: Secondary | ICD-10-CM | POA: Diagnosis not present

## 2017-10-31 DIAGNOSIS — F341 Dysthymic disorder: Secondary | ICD-10-CM | POA: Diagnosis not present

## 2017-11-06 DIAGNOSIS — F341 Dysthymic disorder: Secondary | ICD-10-CM | POA: Diagnosis not present

## 2017-11-13 DIAGNOSIS — F341 Dysthymic disorder: Secondary | ICD-10-CM | POA: Diagnosis not present

## 2017-11-26 DIAGNOSIS — F341 Dysthymic disorder: Secondary | ICD-10-CM | POA: Diagnosis not present

## 2017-12-04 DIAGNOSIS — F341 Dysthymic disorder: Secondary | ICD-10-CM | POA: Diagnosis not present

## 2017-12-31 DIAGNOSIS — F341 Dysthymic disorder: Secondary | ICD-10-CM | POA: Diagnosis not present

## 2018-02-13 DIAGNOSIS — F341 Dysthymic disorder: Secondary | ICD-10-CM | POA: Diagnosis not present

## 2018-03-13 DIAGNOSIS — F341 Dysthymic disorder: Secondary | ICD-10-CM | POA: Diagnosis not present

## 2018-03-29 DIAGNOSIS — F341 Dysthymic disorder: Secondary | ICD-10-CM | POA: Diagnosis not present

## 2018-03-31 DIAGNOSIS — R509 Fever, unspecified: Secondary | ICD-10-CM | POA: Diagnosis not present

## 2018-03-31 DIAGNOSIS — J101 Influenza due to other identified influenza virus with other respiratory manifestations: Secondary | ICD-10-CM | POA: Diagnosis not present

## 2018-04-09 DIAGNOSIS — F341 Dysthymic disorder: Secondary | ICD-10-CM | POA: Diagnosis not present

## 2018-04-10 DIAGNOSIS — Z0000 Encounter for general adult medical examination without abnormal findings: Secondary | ICD-10-CM | POA: Diagnosis not present

## 2018-04-10 DIAGNOSIS — B359 Dermatophytosis, unspecified: Secondary | ICD-10-CM | POA: Diagnosis not present

## 2018-04-24 DIAGNOSIS — F341 Dysthymic disorder: Secondary | ICD-10-CM | POA: Diagnosis not present

## 2018-05-12 DIAGNOSIS — D485 Neoplasm of uncertain behavior of skin: Secondary | ICD-10-CM | POA: Diagnosis not present

## 2018-09-03 DIAGNOSIS — M531 Cervicobrachial syndrome: Secondary | ICD-10-CM | POA: Diagnosis not present

## 2018-09-03 DIAGNOSIS — M9902 Segmental and somatic dysfunction of thoracic region: Secondary | ICD-10-CM | POA: Diagnosis not present

## 2018-09-03 DIAGNOSIS — M5414 Radiculopathy, thoracic region: Secondary | ICD-10-CM | POA: Diagnosis not present

## 2018-09-03 DIAGNOSIS — R51 Headache: Secondary | ICD-10-CM | POA: Diagnosis not present

## 2018-09-04 DIAGNOSIS — R51 Headache: Secondary | ICD-10-CM | POA: Diagnosis not present

## 2018-09-04 DIAGNOSIS — M5414 Radiculopathy, thoracic region: Secondary | ICD-10-CM | POA: Diagnosis not present

## 2018-09-04 DIAGNOSIS — M531 Cervicobrachial syndrome: Secondary | ICD-10-CM | POA: Diagnosis not present

## 2018-09-04 DIAGNOSIS — M9902 Segmental and somatic dysfunction of thoracic region: Secondary | ICD-10-CM | POA: Diagnosis not present

## 2018-09-08 DIAGNOSIS — R51 Headache: Secondary | ICD-10-CM | POA: Diagnosis not present

## 2018-09-08 DIAGNOSIS — M5414 Radiculopathy, thoracic region: Secondary | ICD-10-CM | POA: Diagnosis not present

## 2018-09-08 DIAGNOSIS — M9902 Segmental and somatic dysfunction of thoracic region: Secondary | ICD-10-CM | POA: Diagnosis not present

## 2018-09-08 DIAGNOSIS — M531 Cervicobrachial syndrome: Secondary | ICD-10-CM | POA: Diagnosis not present

## 2018-09-09 DIAGNOSIS — M9902 Segmental and somatic dysfunction of thoracic region: Secondary | ICD-10-CM | POA: Diagnosis not present

## 2018-09-09 DIAGNOSIS — R51 Headache: Secondary | ICD-10-CM | POA: Diagnosis not present

## 2018-09-09 DIAGNOSIS — M5414 Radiculopathy, thoracic region: Secondary | ICD-10-CM | POA: Diagnosis not present

## 2018-09-09 DIAGNOSIS — M531 Cervicobrachial syndrome: Secondary | ICD-10-CM | POA: Diagnosis not present

## 2018-09-12 DIAGNOSIS — M5414 Radiculopathy, thoracic region: Secondary | ICD-10-CM | POA: Diagnosis not present

## 2018-09-12 DIAGNOSIS — R51 Headache: Secondary | ICD-10-CM | POA: Diagnosis not present

## 2018-09-12 DIAGNOSIS — M9902 Segmental and somatic dysfunction of thoracic region: Secondary | ICD-10-CM | POA: Diagnosis not present

## 2018-09-12 DIAGNOSIS — M531 Cervicobrachial syndrome: Secondary | ICD-10-CM | POA: Diagnosis not present

## 2018-09-12 DIAGNOSIS — Z20828 Contact with and (suspected) exposure to other viral communicable diseases: Secondary | ICD-10-CM | POA: Diagnosis not present

## 2018-09-15 DIAGNOSIS — M531 Cervicobrachial syndrome: Secondary | ICD-10-CM | POA: Diagnosis not present

## 2018-09-15 DIAGNOSIS — M5414 Radiculopathy, thoracic region: Secondary | ICD-10-CM | POA: Diagnosis not present

## 2018-09-15 DIAGNOSIS — R51 Headache: Secondary | ICD-10-CM | POA: Diagnosis not present

## 2018-09-15 DIAGNOSIS — M9902 Segmental and somatic dysfunction of thoracic region: Secondary | ICD-10-CM | POA: Diagnosis not present

## 2018-09-18 DIAGNOSIS — M531 Cervicobrachial syndrome: Secondary | ICD-10-CM | POA: Diagnosis not present

## 2018-09-18 DIAGNOSIS — M5414 Radiculopathy, thoracic region: Secondary | ICD-10-CM | POA: Diagnosis not present

## 2018-09-18 DIAGNOSIS — M9902 Segmental and somatic dysfunction of thoracic region: Secondary | ICD-10-CM | POA: Diagnosis not present

## 2018-09-18 DIAGNOSIS — R51 Headache: Secondary | ICD-10-CM | POA: Diagnosis not present

## 2018-09-24 DIAGNOSIS — M5414 Radiculopathy, thoracic region: Secondary | ICD-10-CM | POA: Diagnosis not present

## 2018-09-24 DIAGNOSIS — M9902 Segmental and somatic dysfunction of thoracic region: Secondary | ICD-10-CM | POA: Diagnosis not present

## 2018-09-24 DIAGNOSIS — M531 Cervicobrachial syndrome: Secondary | ICD-10-CM | POA: Diagnosis not present

## 2018-09-24 DIAGNOSIS — R51 Headache: Secondary | ICD-10-CM | POA: Diagnosis not present

## 2018-09-25 DIAGNOSIS — M531 Cervicobrachial syndrome: Secondary | ICD-10-CM | POA: Diagnosis not present

## 2018-09-25 DIAGNOSIS — M9902 Segmental and somatic dysfunction of thoracic region: Secondary | ICD-10-CM | POA: Diagnosis not present

## 2018-09-25 DIAGNOSIS — R51 Headache: Secondary | ICD-10-CM | POA: Diagnosis not present

## 2018-09-25 DIAGNOSIS — M5414 Radiculopathy, thoracic region: Secondary | ICD-10-CM | POA: Diagnosis not present

## 2018-10-02 DIAGNOSIS — M531 Cervicobrachial syndrome: Secondary | ICD-10-CM | POA: Diagnosis not present

## 2018-10-02 DIAGNOSIS — M9902 Segmental and somatic dysfunction of thoracic region: Secondary | ICD-10-CM | POA: Diagnosis not present

## 2018-10-02 DIAGNOSIS — R51 Headache: Secondary | ICD-10-CM | POA: Diagnosis not present

## 2018-10-02 DIAGNOSIS — M5414 Radiculopathy, thoracic region: Secondary | ICD-10-CM | POA: Diagnosis not present

## 2018-10-07 DIAGNOSIS — M9902 Segmental and somatic dysfunction of thoracic region: Secondary | ICD-10-CM | POA: Diagnosis not present

## 2018-10-07 DIAGNOSIS — M5414 Radiculopathy, thoracic region: Secondary | ICD-10-CM | POA: Diagnosis not present

## 2018-10-07 DIAGNOSIS — M531 Cervicobrachial syndrome: Secondary | ICD-10-CM | POA: Diagnosis not present

## 2018-10-07 DIAGNOSIS — R51 Headache: Secondary | ICD-10-CM | POA: Diagnosis not present

## 2018-10-09 DIAGNOSIS — M531 Cervicobrachial syndrome: Secondary | ICD-10-CM | POA: Diagnosis not present

## 2018-10-09 DIAGNOSIS — R51 Headache: Secondary | ICD-10-CM | POA: Diagnosis not present

## 2018-10-09 DIAGNOSIS — M5414 Radiculopathy, thoracic region: Secondary | ICD-10-CM | POA: Diagnosis not present

## 2018-10-09 DIAGNOSIS — M9902 Segmental and somatic dysfunction of thoracic region: Secondary | ICD-10-CM | POA: Diagnosis not present

## 2018-10-14 DIAGNOSIS — R51 Headache: Secondary | ICD-10-CM | POA: Diagnosis not present

## 2018-10-14 DIAGNOSIS — M9902 Segmental and somatic dysfunction of thoracic region: Secondary | ICD-10-CM | POA: Diagnosis not present

## 2018-10-14 DIAGNOSIS — M531 Cervicobrachial syndrome: Secondary | ICD-10-CM | POA: Diagnosis not present

## 2018-10-14 DIAGNOSIS — M5414 Radiculopathy, thoracic region: Secondary | ICD-10-CM | POA: Diagnosis not present

## 2018-10-15 DIAGNOSIS — M5414 Radiculopathy, thoracic region: Secondary | ICD-10-CM | POA: Diagnosis not present

## 2018-10-15 DIAGNOSIS — M9902 Segmental and somatic dysfunction of thoracic region: Secondary | ICD-10-CM | POA: Diagnosis not present

## 2018-10-15 DIAGNOSIS — M531 Cervicobrachial syndrome: Secondary | ICD-10-CM | POA: Diagnosis not present

## 2018-10-15 DIAGNOSIS — R51 Headache: Secondary | ICD-10-CM | POA: Diagnosis not present

## 2018-10-22 DIAGNOSIS — M9902 Segmental and somatic dysfunction of thoracic region: Secondary | ICD-10-CM | POA: Diagnosis not present

## 2018-10-22 DIAGNOSIS — R51 Headache: Secondary | ICD-10-CM | POA: Diagnosis not present

## 2018-10-22 DIAGNOSIS — M5414 Radiculopathy, thoracic region: Secondary | ICD-10-CM | POA: Diagnosis not present

## 2018-10-22 DIAGNOSIS — M531 Cervicobrachial syndrome: Secondary | ICD-10-CM | POA: Diagnosis not present

## 2018-10-23 DIAGNOSIS — M5414 Radiculopathy, thoracic region: Secondary | ICD-10-CM | POA: Diagnosis not present

## 2018-10-28 DIAGNOSIS — M5414 Radiculopathy, thoracic region: Secondary | ICD-10-CM | POA: Diagnosis not present

## 2018-10-30 DIAGNOSIS — M531 Cervicobrachial syndrome: Secondary | ICD-10-CM | POA: Diagnosis not present

## 2018-10-30 DIAGNOSIS — R519 Headache, unspecified: Secondary | ICD-10-CM | POA: Diagnosis not present

## 2018-10-30 DIAGNOSIS — M9902 Segmental and somatic dysfunction of thoracic region: Secondary | ICD-10-CM | POA: Diagnosis not present

## 2018-10-30 DIAGNOSIS — M5414 Radiculopathy, thoracic region: Secondary | ICD-10-CM | POA: Diagnosis not present

## 2018-11-04 DIAGNOSIS — R519 Headache, unspecified: Secondary | ICD-10-CM | POA: Diagnosis not present

## 2018-11-04 DIAGNOSIS — M5414 Radiculopathy, thoracic region: Secondary | ICD-10-CM | POA: Diagnosis not present

## 2018-11-04 DIAGNOSIS — M9902 Segmental and somatic dysfunction of thoracic region: Secondary | ICD-10-CM | POA: Diagnosis not present

## 2018-11-04 DIAGNOSIS — M531 Cervicobrachial syndrome: Secondary | ICD-10-CM | POA: Diagnosis not present

## 2018-11-06 DIAGNOSIS — M9902 Segmental and somatic dysfunction of thoracic region: Secondary | ICD-10-CM | POA: Diagnosis not present

## 2018-11-06 DIAGNOSIS — M5414 Radiculopathy, thoracic region: Secondary | ICD-10-CM | POA: Diagnosis not present

## 2018-11-06 DIAGNOSIS — R519 Headache, unspecified: Secondary | ICD-10-CM | POA: Diagnosis not present

## 2018-11-06 DIAGNOSIS — M531 Cervicobrachial syndrome: Secondary | ICD-10-CM | POA: Diagnosis not present

## 2018-11-11 DIAGNOSIS — M531 Cervicobrachial syndrome: Secondary | ICD-10-CM | POA: Diagnosis not present

## 2018-11-11 DIAGNOSIS — R519 Headache, unspecified: Secondary | ICD-10-CM | POA: Diagnosis not present

## 2018-11-11 DIAGNOSIS — M9902 Segmental and somatic dysfunction of thoracic region: Secondary | ICD-10-CM | POA: Diagnosis not present

## 2018-11-11 DIAGNOSIS — M5414 Radiculopathy, thoracic region: Secondary | ICD-10-CM | POA: Diagnosis not present

## 2018-11-28 DIAGNOSIS — Z23 Encounter for immunization: Secondary | ICD-10-CM | POA: Diagnosis not present

## 2018-11-28 DIAGNOSIS — H66002 Acute suppurative otitis media without spontaneous rupture of ear drum, left ear: Secondary | ICD-10-CM | POA: Diagnosis not present

## 2018-11-28 DIAGNOSIS — R05 Cough: Secondary | ICD-10-CM | POA: Diagnosis not present

## 2018-11-28 DIAGNOSIS — R5383 Other fatigue: Secondary | ICD-10-CM | POA: Diagnosis not present

## 2018-11-28 DIAGNOSIS — J029 Acute pharyngitis, unspecified: Secondary | ICD-10-CM | POA: Diagnosis not present

## 2019-01-09 DIAGNOSIS — F418 Other specified anxiety disorders: Secondary | ICD-10-CM | POA: Diagnosis not present

## 2019-01-27 DIAGNOSIS — Z20828 Contact with and (suspected) exposure to other viral communicable diseases: Secondary | ICD-10-CM | POA: Diagnosis not present

## 2019-01-28 DIAGNOSIS — F341 Dysthymic disorder: Secondary | ICD-10-CM | POA: Diagnosis not present

## 2019-02-11 DIAGNOSIS — F341 Dysthymic disorder: Secondary | ICD-10-CM | POA: Diagnosis not present

## 2019-02-18 DIAGNOSIS — F341 Dysthymic disorder: Secondary | ICD-10-CM | POA: Diagnosis not present

## 2019-03-05 DIAGNOSIS — F341 Dysthymic disorder: Secondary | ICD-10-CM | POA: Diagnosis not present

## 2019-03-06 DIAGNOSIS — K52832 Lymphocytic colitis: Secondary | ICD-10-CM | POA: Diagnosis not present

## 2019-03-06 DIAGNOSIS — R110 Nausea: Secondary | ICD-10-CM | POA: Diagnosis not present

## 2019-03-06 DIAGNOSIS — R1084 Generalized abdominal pain: Secondary | ICD-10-CM | POA: Diagnosis not present

## 2019-03-06 DIAGNOSIS — R197 Diarrhea, unspecified: Secondary | ICD-10-CM | POA: Diagnosis not present

## 2019-03-06 DIAGNOSIS — R109 Unspecified abdominal pain: Secondary | ICD-10-CM | POA: Diagnosis not present

## 2019-03-12 DIAGNOSIS — F341 Dysthymic disorder: Secondary | ICD-10-CM | POA: Diagnosis not present

## 2019-03-17 DIAGNOSIS — F341 Dysthymic disorder: Secondary | ICD-10-CM | POA: Diagnosis not present

## 2019-03-31 DIAGNOSIS — F341 Dysthymic disorder: Secondary | ICD-10-CM | POA: Diagnosis not present

## 2019-04-09 DIAGNOSIS — Z03818 Encounter for observation for suspected exposure to other biological agents ruled out: Secondary | ICD-10-CM | POA: Diagnosis not present

## 2019-04-09 DIAGNOSIS — Z20828 Contact with and (suspected) exposure to other viral communicable diseases: Secondary | ICD-10-CM | POA: Diagnosis not present

## 2019-04-13 DIAGNOSIS — Z01419 Encounter for gynecological examination (general) (routine) without abnormal findings: Secondary | ICD-10-CM | POA: Diagnosis not present

## 2019-04-13 DIAGNOSIS — Z681 Body mass index (BMI) 19 or less, adult: Secondary | ICD-10-CM | POA: Diagnosis not present

## 2019-04-15 DIAGNOSIS — Z20828 Contact with and (suspected) exposure to other viral communicable diseases: Secondary | ICD-10-CM | POA: Diagnosis not present

## 2019-04-15 DIAGNOSIS — U071 COVID-19: Secondary | ICD-10-CM | POA: Diagnosis not present

## 2019-04-16 DIAGNOSIS — Z20828 Contact with and (suspected) exposure to other viral communicable diseases: Secondary | ICD-10-CM | POA: Diagnosis not present

## 2019-04-23 DIAGNOSIS — Z03818 Encounter for observation for suspected exposure to other biological agents ruled out: Secondary | ICD-10-CM | POA: Diagnosis not present

## 2019-04-23 DIAGNOSIS — Z20828 Contact with and (suspected) exposure to other viral communicable diseases: Secondary | ICD-10-CM | POA: Diagnosis not present

## 2019-04-27 DIAGNOSIS — Z3202 Encounter for pregnancy test, result negative: Secondary | ICD-10-CM | POA: Diagnosis not present

## 2019-04-27 DIAGNOSIS — Z681 Body mass index (BMI) 19 or less, adult: Secondary | ICD-10-CM | POA: Diagnosis not present

## 2019-04-27 DIAGNOSIS — Z30430 Encounter for insertion of intrauterine contraceptive device: Secondary | ICD-10-CM | POA: Diagnosis not present

## 2019-06-23 DIAGNOSIS — R519 Headache, unspecified: Secondary | ICD-10-CM | POA: Diagnosis not present

## 2019-06-26 DIAGNOSIS — Z6820 Body mass index (BMI) 20.0-20.9, adult: Secondary | ICD-10-CM | POA: Diagnosis not present

## 2019-06-26 DIAGNOSIS — Z30431 Encounter for routine checking of intrauterine contraceptive device: Secondary | ICD-10-CM | POA: Diagnosis not present

## 2020-02-02 DIAGNOSIS — Z20822 Contact with and (suspected) exposure to covid-19: Secondary | ICD-10-CM | POA: Diagnosis not present

## 2020-05-24 DIAGNOSIS — Z01419 Encounter for gynecological examination (general) (routine) without abnormal findings: Secondary | ICD-10-CM | POA: Diagnosis not present

## 2020-05-24 DIAGNOSIS — Z124 Encounter for screening for malignant neoplasm of cervix: Secondary | ICD-10-CM | POA: Diagnosis not present

## 2020-05-24 LAB — HM PAP SMEAR: HM Pap smear: NORMAL

## 2020-08-11 DIAGNOSIS — J029 Acute pharyngitis, unspecified: Secondary | ICD-10-CM | POA: Diagnosis not present

## 2020-08-11 DIAGNOSIS — Z87898 Personal history of other specified conditions: Secondary | ICD-10-CM | POA: Diagnosis not present

## 2021-06-16 DIAGNOSIS — Z01419 Encounter for gynecological examination (general) (routine) without abnormal findings: Secondary | ICD-10-CM | POA: Diagnosis not present

## 2021-06-16 DIAGNOSIS — Z319 Encounter for procreative management, unspecified: Secondary | ICD-10-CM | POA: Diagnosis not present

## 2022-03-15 ENCOUNTER — Encounter: Payer: Self-pay | Admitting: Nurse Practitioner

## 2022-03-15 ENCOUNTER — Ambulatory Visit: Payer: BC Managed Care – PPO | Admitting: Nurse Practitioner

## 2022-03-15 VITALS — BP 98/64 | HR 76 | Temp 98.7°F | Resp 16 | Ht 61.5 in | Wt 108.0 lb

## 2022-03-15 DIAGNOSIS — Z0000 Encounter for general adult medical examination without abnormal findings: Secondary | ICD-10-CM

## 2022-03-15 DIAGNOSIS — Z1321 Encounter for screening for nutritional disorder: Secondary | ICD-10-CM

## 2022-03-15 DIAGNOSIS — Z13220 Encounter for screening for lipoid disorders: Secondary | ICD-10-CM | POA: Diagnosis not present

## 2022-03-15 DIAGNOSIS — F419 Anxiety disorder, unspecified: Secondary | ICD-10-CM | POA: Diagnosis not present

## 2022-03-15 LAB — COMPREHENSIVE METABOLIC PANEL
ALT: 9 U/L (ref 0–35)
AST: 13 U/L (ref 0–37)
Albumin: 4.6 g/dL (ref 3.5–5.2)
Alkaline Phosphatase: 24 U/L — ABNORMAL LOW (ref 39–117)
BUN: 11 mg/dL (ref 6–23)
CO2: 26 mEq/L (ref 19–32)
Calcium: 9.1 mg/dL (ref 8.4–10.5)
Chloride: 106 mEq/L (ref 96–112)
Creatinine, Ser: 0.73 mg/dL (ref 0.40–1.20)
GFR: 108.38 mL/min (ref >60.00)
Glucose, Bld: 81 mg/dL (ref 70–99)
Potassium: 4 mEq/L (ref 3.5–5.1)
Sodium: 139 mEq/L (ref 135–145)
Total Bilirubin: 0.6 mg/dL (ref 0.2–1.2)
Total Protein: 6.6 g/dL (ref 6.0–8.3)

## 2022-03-15 LAB — CBC
HCT: 38.4 % (ref 36.0–46.0)
Hemoglobin: 13.2 g/dL (ref 12.0–15.0)
MCHC: 34.4 g/dL (ref 30.0–36.0)
MCV: 88 fl (ref 78.0–100.0)
Platelets: 308 10*3/uL (ref 150.0–400.0)
RBC: 4.36 Mil/uL (ref 3.87–5.11)
RDW: 12.9 % (ref 11.5–15.5)
WBC: 5.7 10*3/uL (ref 4.0–10.5)

## 2022-03-15 LAB — VITAMIN D 25 HYDROXY (VIT D DEFICIENCY, FRACTURES): VITD: 11.38 ng/mL — ABNORMAL LOW (ref 30.00–100.00)

## 2022-03-15 LAB — LIPID PANEL
Cholesterol: 211 mg/dL — ABNORMAL HIGH (ref 0–200)
HDL: 63.2 mg/dL (ref >39.00)
LDL Cholesterol: 136 mg/dL — ABNORMAL HIGH (ref 0–99)
NonHDL: 148.09
Total CHOL/HDL Ratio: 3
Triglycerides: 62 mg/dL (ref 0.0–149.0)
VLDL: 12.4 mg/dL (ref 0.0–40.0)

## 2022-03-15 LAB — TSH: TSH: 1.02 u[IU]/mL (ref 0.35–5.50)

## 2022-03-15 MED ORDER — HYDROXYZINE HCL 10 MG PO TABS: 10.0000 mg | ORAL_TABLET | Freq: Every evening | ORAL | 0 refills | Status: DC | PRN

## 2022-03-15 NOTE — Progress Notes (Signed)
New Patient Office Visit  Subjective    Patient ID: Adriana Smith, female    DOB: 1990/11/16  Age: 32 y.o. MRN: [id number]  CC:  Chief Complaint  Patient presents with   Establish Care    HPI Adriana Smith presents to establish care  Anxiety/depression  Headaches: states that she will take ibuprofen or excedrin a ciple times a week. Frontal part of the head. States that she has floaters. No sound sensity   Thyroid   for complete physical and follow up of chronic conditions.  Immunizations: -Tetanus: Completed in 2023 -Influenza: refused -Shingles: Too young -Pneumonia: Too young  -HPV: utd   Diet: Fair diet. States she gets 2 meals a day and somedays one snacks. Coffee, lots of water.   Exercise: . States that she will walk or do sation bike 3 times a week 30-45  Eye exam: PRN Dental exam: Completes semi-annually   Pap Smear: Completed in [facility]. Due this year. States that she has abnormal   Mammogram: Too young, average risk  Colonoscopy: Completed in 2015  states that she was having GI issues and had a colonosocpy in the past.  Lung Cancer Screening: N/A Dexa: N/A  Sleep: States that she has been having faigue. 10230-11 and up by 6-630. Sometimes she feels rested. Does snore     Outpatient Encounter Medications as of 03/15/2022  Medication Sig   hydrOXYzine (ATARAX) 10 MG tablet Take 1 tablet (10 mg total) by mouth at bedtime as needed.   ciprofloxacin (CIPRO) 250 MG tablet Take 1 tablet (250 mg total) by mouth 2 (two) times daily.   norgestrel-ethinyl estradiol (LO/OVRAL,CRYSELLE) 0.3-30 MG-MCG tablet Take 1 tablet by mouth daily.   No facility-administered encounter medications on file as of 03/15/2022.    Past Medical History:  Diagnosis Date   Anxiety    Chronic headaches    Depression    Thyroid disease     Past Surgical History:  Procedure Laterality Date   TONSILLECTOMY      Family History  Problem Relation Age of Onset    Hypertension Father    Congestive Heart Failure Maternal Grandmother    Diabetes Maternal Grandfather    Stroke Maternal Grandfather    Diabetes Paternal Grandmother     Social History   Socioeconomic History   Marital status: Single    Spouse name: Not on file   Number of children: 0   Years of education: Not on file   Highest education level: Bachelor's degree (e.g., BA, AB, BS)  Occupational History   Occupation: Admissions Counselor    Employer: [employer]  Tobacco Use   Smoking status: Never   Smokeless tobacco: Never  Vaping Use   Vaping Use: Never used  Substance and Sexual Activity   Alcohol use: Yes    Comment: occ   Drug use: No   Sexual activity: Yes    Birth control/protection: I.U.D.  Other Topics Concern   Not on file  Social History Narrative   Fulltime: Press photographer      Hobbies: work, walking, [employer], hikes.   Social Determinants of Health   Financial Resource Strain: Not on file  Food Insecurity: Not on file  Transportation Needs: Not on file  Physical Activity: Not on file  Stress: Not on file  Social Connections: Not on file  Intimate Partner Violence: Not on file    Review of Systems  Constitutional:  Negative for chills and fever.  Respiratory:  Negative  for shortness of breath.   Cardiovascular:  Negative for chest pain and leg swelling.  Gastrointestinal:  Negative for abdominal pain, blood in stool, constipation, diarrhea, nausea and vomiting.       Bm 6-7 a day   Genitourinary:  Negative for dysuria and hematuria.  Neurological:  Positive for headaches. Negative for tingling.  Psychiatric/Behavioral:  Negative for hallucinations and suicidal ideas.         Objective    BP 98/64   Pulse 76   Temp 98.7 F (37.1 C)   Resp 16   Ht 5' 1.5" (1.562 m)   Wt 108 lb (49 kg)   SpO2 99%   BMI 20.08 kg/m   Physical Exam Vitals and nursing note reviewed.  Constitutional:      Appearance: Normal appearance.  HENT:      Right Ear: Tympanic membrane, ear canal and external ear normal.     Left Ear: Tympanic membrane, ear canal and external ear normal.     Mouth/Throat:     Mouth: Mucous membranes are moist.     Pharynx: Oropharynx is clear.  Eyes:     Extraocular Movements: Extraocular movements intact.     Pupils: Pupils are equal, round, and reactive to light.  Cardiovascular:     Rate and Rhythm: Normal rate and regular rhythm.     Pulses: Normal pulses.     Heart sounds: Normal heart sounds.  Pulmonary:     Effort: Pulmonary effort is normal.     Breath sounds: Normal breath sounds.  Abdominal:     General: Bowel sounds are normal. There is no distension.     Palpations: There is no mass.     Tenderness: There is no abdominal tenderness.     Hernia: No hernia is present.  Musculoskeletal:     Right lower leg: No edema.     Left lower leg: No edema.  Lymphadenopathy:     Cervical: No cervical adenopathy.  Skin:    General: Skin is warm.  Neurological:     General: No focal deficit present.     Mental Status: She is alert.     Deep Tendon Reflexes:     Reflex Scores:      Bicep reflexes are 2+ on the right side and 2+ on the left side.      Patellar reflexes are 2+ on the right side and 2+ on the left side.    Comments: Bilateral upper and lower extremity strength 5/5  Psychiatric:        Mood and Affect: Mood normal.        Behavior: Behavior normal.        Thought Content: Thought content normal.        Judgment: Judgment normal.         Assessment & Plan:   Problem List Items Addressed This Visit       Other   Anxiety - Primary    Patient has bouts of anxiety in the past.  Reports she was on an SSRI and did not feel well.  Will start on hydroxyzine 10 mg nightly as needed sleep this helps with her sleep and anxiety      Relevant Medications   hydrOXYzine (ATARAX) 10 MG tablet   Other Relevant Orders   TSH (Completed)   Preventative health care    Discussed  age-appropriate immunizations and screening exams.  Patient is up-to-date on tetanus shot, HPV.  Patient declines flu vaccine.  Patient  is too young for CRC screening or breast cancer screening is followed by GYN for her Paps.  Patient was given information at discharge in regards to preventative healthcare maintenance with anticipatory guidance      Relevant Orders   CBC (Completed)   Comprehensive metabolic panel (Completed)   Other Visit Diagnoses     Screening for lipid disorders       Relevant Orders   Lipid panel (Completed)   Encounter for vitamin deficiency screening       Relevant Orders   VITAMIN D 25 Hydroxy (Vit-D Deficiency, Fractures) (Completed)       Return in about 1 year (around 03/16/2023) for CPE and Labs.   Romilda Garret, NP

## 2022-03-15 NOTE — Assessment & Plan Note (Signed)
Patient has bouts of anxiety in the past.  Reports she was on an SSRI and did not feel well.  Will start on hydroxyzine 10 mg nightly as needed sleep this helps with her sleep and anxiety

## 2022-03-15 NOTE — Assessment & Plan Note (Signed)
Discussed age-appropriate immunizations and screening exams.  Patient is up-to-date on tetanus shot, HPV.  Patient declines flu vaccine.  Patient is too young for CRC screening or breast cancer screening is followed by GYN for her Paps.  Patient was given information at discharge in regards to preventative healthcare maintenance with anticipatory guidance

## 2022-03-15 NOTE — Patient Instructions (Addendum)
Nice to see you today I will be in touch with the labs once I have them Follow up with me in 1 year for your next physical, sooner if you need me  You can message me on mychart about the anxiety medication and if it is helpful or not

## 2022-03-16 ENCOUNTER — Other Ambulatory Visit: Payer: Self-pay | Admitting: Nurse Practitioner

## 2022-03-16 DIAGNOSIS — E559 Vitamin D deficiency, unspecified: Secondary | ICD-10-CM

## 2022-03-16 MED ORDER — VITAMIN D (ERGOCALCIFEROL) 1.25 MG (50000 UNIT) PO CAPS: 50000.0000 [IU] | ORAL_CAPSULE | ORAL | 0 refills | Status: AC

## 2022-06-19 DIAGNOSIS — Z01419 Encounter for gynecological examination (general) (routine) without abnormal findings: Secondary | ICD-10-CM | POA: Diagnosis not present

## 2022-06-19 DIAGNOSIS — Z1329 Encounter for screening for other suspected endocrine disorder: Secondary | ICD-10-CM | POA: Diagnosis not present

## 2022-06-19 DIAGNOSIS — Z13220 Encounter for screening for lipoid disorders: Secondary | ICD-10-CM | POA: Diagnosis not present

## 2022-06-19 DIAGNOSIS — Z6820 Body mass index (BMI) 20.0-20.9, adult: Secondary | ICD-10-CM | POA: Diagnosis not present

## 2022-06-19 DIAGNOSIS — Z130 Encounter for screening for diseases of the blood and blood-forming organs and certain disorders involving the immune mechanism: Secondary | ICD-10-CM | POA: Diagnosis not present

## 2022-06-19 DIAGNOSIS — R519 Headache, unspecified: Secondary | ICD-10-CM | POA: Diagnosis not present

## 2022-06-20 NOTE — Progress Notes (Unsigned)
NEUROLOGY CONSULTATION NOTE  BENNET MADDOX MRN: [id number] DOB: [birth year]  Referring provider: Waynard Reeds, MD Primary care provider: Mordecai Maes, NP  Reason for consult:  headache  Assessment/Plan:   Persistent right temporal headache - unclear etiology.  Reports increased stress raising possibility of tension-type headaches and jaw clenching or teeth grinding.  Consider TMJ dysfunction but no severe pain to palpation.  Not consistent with migraine  Start baclofen 5mg  three times daily.  If she is unable to tolerate, will change to gabapentin, starting with 100mg  at bedtime. Check MRI of brain with and without contrast.  If unrevealing, would recommend following up with dentist or oral surgeon.   Subjective:  Adriana Smith is a 32 year old female with thyroid disease, depression and anxiety who presents for headache.  History supplemented by referring provider's note.  Onset:  since her late-20s.  Worse this year.  They used to occur 1 to 2 times a week.  Now daily/persistent, progressively gotten worse Location:  initially mid-frontal.  For past 2 months, starts in right temporal/TMJ region and radiates up to the left temporal region.  Right temple tender to touch. Quality:  aching/bruised feeling.  Sometimes shooting pain from the right temple to the left temple.  Feels like she needs to open mouth to release pressure from the right ear. Intensity:  7/10.   Associated symptoms:  Sometimes squiggly lines off and on.  She denies associated nausea, vomiting, photophobia, phonophobia, unilateral numbness or weakness. Duration:  persistent Frequency:  persistent Triggers/aggravating factors:  chewing may slightly aggravate Relieving factors:  ibuprofen or Excedrin Activity:  able to function  Past NSAIDS/analgesics:  acetaminophen Past abortive triptans:  none Past abortive ergotamine:  none Past muscle relaxants:  none Past anti-emetic:  none Past antihypertensive  medications:  none Past antidepressant medications:  none Past anticonvulsant medications:  none Past anti-CGRP:  none Past vitamins/Herbal/Supplements:  none Past antihistamines/decongestants:  none Other past therapies:  none  Current NSAIDS/analgesics:  Excedrin, ibuprofen Current triptans:  none Current ergotamine:  none Current anti-emetic:  none Current muscle relaxants:  none Current Antihypertensive medications:  none Current Antidepressant medications:  none Current Anticonvulsant medications:  none Current anti-CGRP:  none Current Vitamins/Herbal/Supplements:  D Current Antihistamines/Decongestants:  none Other therapy:  none Birth control:  Kyleena Other medications:  hydroxyzine   Caffeine:  2-3 cups coffee in morning Diet:  4-5 bottles of water daily.  Skips breakfast. Exercise:  walks Depression:  stable; Anxiety:  mild.  Sometimes she notes difficulty catching her breath or chest pressure.   Other pain:  no Sleep hygiene:  OK.  Sleeps uninterrupted.   Sometimes she may clench her teeth.  Not aware of grinding her teeth at night.  Family history of headache:  mother      PAST MEDICAL HISTORY: Past Medical History:  Diagnosis Date   Anxiety    Chronic headaches    Depression    Thyroid disease     PAST SURGICAL HISTORY: Past Surgical History:  Procedure Laterality Date   TONSILLECTOMY      MEDICATIONS: Current Outpatient Medications on File Prior to Visit  Medication Sig Dispense Refill   Vitamin D, Ergocalciferol, (DRISDOL) 1.25 MG (50000 UNIT) CAPS capsule Take 1 capsule (50,000 Units total) by mouth every 7 (seven) days. 12 capsule 0   hydrOXYzine (ATARAX) 10 MG tablet Take 1 tablet (10 mg total) by mouth at bedtime as needed. 30 tablet 0   No current facility-administered medications on file prior  to visit.    ALLERGIES: Allergies  Allergen Reactions   Latex Hives    Only when she goes to the dentist.    FAMILY HISTORY: Family  History  Problem Relation Age of Onset   Hypertension Father    Congestive Heart Failure Maternal Grandmother    Diabetes Maternal Grandfather    Stroke Maternal Grandfather    Diabetes Paternal Grandmother     Objective:  Blood pressure 102/75, pulse 82, height 5\' 2"  (1.575 m), weight 107 lb (48.5 kg), SpO2 98 %. General: No acute distress.  Patient appears well-groomed.   Head:  Normocephalic/atraumatic.  Mild tenderness to palpation of right TMJ. Eyes:  fundi examined but not visualized Neck: supple, mild bilateral paraspinal tenderness, full range of motion Heart: regular rate and rhythm Vascular: No carotid bruits. Neurological Exam: Mental status: alert and oriented to person, place, and time, speech fluent and not dysarthric, language intact. Cranial nerves: CN I: not tested CN II: pupils equal, round and reactive to light, visual fields intact CN III, IV, VI:  full range of motion, no nystagmus, no ptosis CN V: endorses tingling with light touch over right V1. CN VII: upper and lower face symmetric CN VIII: hearing intact CN IX, X: gag intact, uvula midline CN XI: sternocleidomastoid and trapezius muscles intact CN XII: tongue midline Bulk & Tone: normal, no fasciculations. Motor:  muscle strength 5/5 throughout Sensation:  Pinprick, temperature and vibratory sensation intact. Deep Tendon Reflexes:  2+ throughout,  toes downgoing.   Finger to nose testing:  Without dysmetria.   Heel to shin:  Without dysmetria.   Gait:  Normal station and stride.  Romberg negative.    Thank you for allowing me to take part in the care of this patient.  Shon Millet, DO  CC:  Mordecai Maes, NP  Waynard Reeds, MD

## 2022-06-21 ENCOUNTER — Encounter: Payer: Self-pay | Admitting: Neurology

## 2022-06-21 ENCOUNTER — Ambulatory Visit: Payer: BC Managed Care – PPO | Admitting: Neurology

## 2022-06-21 VITALS — BP 102/75 | HR 82 | Ht 62.0 in | Wt 107.0 lb

## 2022-06-21 DIAGNOSIS — R519 Headache, unspecified: Secondary | ICD-10-CM

## 2022-06-21 MED ORDER — BACLOFEN 5 MG PO TABS: 5.0000 mg | ORAL_TABLET | Freq: Three times a day (TID) | ORAL | 5 refills | Status: AC

## 2022-06-21 NOTE — Patient Instructions (Signed)
Check MRI of brain with and without contrast Start baclofen 5mg  three times daily.  Caution for drowsiness.  If you cannot tolerate it, let me know and we can try something else.   Further recommendations pending results.

## 2022-08-03 ENCOUNTER — Ambulatory Visit
Admission: RE | Admit: 2022-08-03 | Discharge: 2022-08-03 | Disposition: A | Discharge status: Home / Self Care | Payer: BC Managed Care – PPO | Source: Ambulatory Visit | Attending: Neurology | Admitting: Neurology

## 2022-08-03 DIAGNOSIS — R519 Headache, unspecified: Secondary | ICD-10-CM

## 2022-08-03 MED ORDER — GADOPICLENOL 0.5 MMOL/ML IV SOLN
5.0000 mL | Freq: Once | INTRAVENOUS | Status: AC | PRN
  Administered 2022-08-03: 5 mL via INTRAVENOUS

## 2022-08-06 NOTE — Progress Notes (Signed)
 Patient advised of her results. °

## 2022-08-21 ENCOUNTER — Telehealth: Payer: Self-pay | Admitting: Nurse Practitioner

## 2022-08-21 DIAGNOSIS — F419 Anxiety disorder, unspecified: Secondary | ICD-10-CM

## 2022-08-21 NOTE — Telephone Encounter (Signed)
Prescription Request  08/21/2022  LOV: 03/15/2022  What is the name of the medication or equipment? hydrOXYzine (ATARAX) 10 MG tablet   Have you contacted your pharmacy to request a refill? No   Which pharmacy would you like this sent to?  [id number] - [place], [place] - [address] [place] [address] Phone: [phone] Fax: [phone]    Patient notified that their request is being sent to the clinical staff for review and that they should receive a response within 2 business days.   Please advise at Mobile [phone] (mobile)

## 2022-08-21 NOTE — Telephone Encounter (Signed)
LAST APPOINTMENT DATE: 03/15/2022   NEXT APPOINTMENT DATE: Visit date not found   ATARAX 10mg   LAST REFILL: 03/15/2022  QTY: #[id number]

## 2022-08-22 MED ORDER — HYDROXYZINE HCL 10 MG PO TABS: 10.0000 mg | ORAL_TABLET | Freq: Every evening | ORAL | 2 refills | Status: AC | PRN

## 2022-08-22 NOTE — Addendum Note (Signed)
Addended by: Eden Emms on: 08/22/2022 10:46 AM   Modules accepted: Orders

## 2022-09-17 ENCOUNTER — Ambulatory Visit: Payer: BC Managed Care – PPO | Admitting: Nurse Practitioner

## 2022-09-17 ENCOUNTER — Encounter: Payer: Self-pay | Admitting: Nurse Practitioner

## 2022-09-17 VITALS — BP 116/68 | HR 76 | Temp 97.5°F | Ht 62.0 in | Wt 107.0 lb

## 2022-09-17 DIAGNOSIS — R41840 Attention and concentration deficit: Secondary | ICD-10-CM

## 2022-09-17 DIAGNOSIS — H6993 Unspecified Eustachian tube disorder, bilateral: Secondary | ICD-10-CM | POA: Diagnosis not present

## 2022-09-17 DIAGNOSIS — F419 Anxiety disorder, unspecified: Secondary | ICD-10-CM

## 2022-09-17 MED ORDER — FLUTICASONE PROPIONATE 50 MCG/ACT NA SUSP
2.0000 | Freq: Every day | NASAL | 0 refills | Status: AC
Start: 2022-09-17 — End: ?

## 2022-09-17 MED ORDER — BUPROPION HCL ER (XL) 150 MG PO TB24: 150.0000 mg | ORAL_TABLET | Freq: Every day | ORAL | 1 refills | Status: DC

## 2022-09-17 NOTE — Assessment & Plan Note (Signed)
Hydroxyzine worked but caused patient to sleep too much and then later fell behind in her professional setting.  Discontinue hydroxyzine start Wellbutrin 150 mg daily.  If this medication not beneficial we did discuss buspirone and that will be the next medication we choose

## 2022-09-17 NOTE — Patient Instructions (Signed)
Nice to see you today Reach out to me in 10-14 days to see how the medication is doing Follow up with me in 1 month in person or virtually

## 2022-09-17 NOTE — Assessment & Plan Note (Signed)
Will start patient on fluticasone nasal spray 50 mcg each actuation 2 times a day.  Did review epistaxis precautions.  Patient use nasal saline as needed if nasal passages dry out and nosebleeds occur

## 2022-09-17 NOTE — Assessment & Plan Note (Signed)
Patient is having difficulty focusing at work do wonder if she is having more ADD versus anxiety.  Will treat with albuterol 150 mg daily.  Did discuss this can incite anxiety she can reach out to me via MyChart if needed prior to office visit

## 2022-09-17 NOTE — Progress Notes (Signed)
Established Patient Office Visit  Subjective   Patient ID: Adriana Smith, female    DOB: [birth year]  Age: 32 y.o. MRN: [id number]  Chief Complaint  Patient presents with   Anxiety      GAD: Patient was seen by me on 03/15/2018 for for anxiety.  Patient was having intermittent bouts of anxiety in the past she was on SSRI did not feel well we did start patient on hydroxyzine 10 mg as needed for anxiety.  Patient is here for follow-up  Sate that she did try the hydroxzine in the past and she states that she does not like. States that is taking it at night and she feels like a zombie and groggy in the am. States that she did feel it was helpful.  States that she feels like she is having a little depression and having toruble focusing on one thing. States that she is under a lot stress at work     Review of Systems  Constitutional:  Negative for chills and fever.  Respiratory:  Negative for shortness of breath.   Cardiovascular:  Negative for chest pain.  Neurological:  Negative for headaches.  Psychiatric/Behavioral:  Negative for hallucinations and suicidal ideas. The patient does not have insomnia (does not feel rested).       Objective:     BP 116/68   Pulse 76   Temp (!) 97.5 F (36.4 C) (Temporal)   Ht 5\' 2"  (1.575 m)   Wt 107 lb (48.5 kg)   SpO2 98%   BMI 19.57 kg/m  BP Readings from Last 3 Encounters:  09/17/22 116/68  06/21/22 102/75  03/15/22 98/64   Wt Readings from Last 3 Encounters:  09/17/22 107 lb (48.5 kg)  06/21/22 107 lb (48.5 kg)  03/15/22 108 lb (49 kg)   SpO2 Readings from Last 3 Encounters:  09/17/22 98%  06/21/22 98%  03/15/22 99%      Physical Exam Vitals and nursing note reviewed.  Constitutional:      Appearance: Normal appearance.  HENT:     Right Ear: Ear canal and external ear normal.     Left Ear: Ear canal and external ear normal.     Ears:     Comments: Ear fluid behind bilateral TM Cardiovascular:     Rate and Rhythm:  Normal rate and regular rhythm.     Heart sounds: Normal heart sounds.  Pulmonary:     Effort: Pulmonary effort is normal.     Breath sounds: Normal breath sounds.  Neurological:     Mental Status: She is alert.      No results found for any visits on 09/17/22.    The ASCVD Risk score (Arnett DK, et al., 2019) failed to calculate for the following reasons:   The 2019 ASCVD risk score is only valid for ages 49 to 37    Assessment & Plan:   Problem List Items Addressed This Visit       Nervous and Auditory   Dysfunction of both eustachian tubes - Primary    Will start patient on fluticasone nasal spray 50 mcg each actuation 2 times a day.  Did review epistaxis precautions.  Patient use nasal saline as needed if nasal passages dry out and nosebleeds occur      Relevant Medications   fluticasone (FLONASE) 50 MCG/ACT nasal spray     Other   Anxiety    Hydroxyzine worked but caused patient to sleep too much and then later fell  behind in her professional setting.  Discontinue hydroxyzine start Wellbutrin 150 mg daily.  If this medication not beneficial we did discuss buspirone and that will be the next medication we choose      Relevant Medications   buPROPion (WELLBUTRIN XL) 150 MG 24 hr tablet   Concentration deficit    Patient is having difficulty focusing at work do wonder if she is having more ADD versus anxiety.  Will treat with albuterol 150 mg daily.  Did discuss this can incite anxiety she can reach out to me via MyChart if needed prior to office visit      Relevant Medications   buPROPion (WELLBUTRIN XL) 150 MG 24 hr tablet    Return in about 4 weeks (around 10/15/2022) for Medication recheck .    Audria Nine, NP

## 2022-10-26 ENCOUNTER — Ambulatory Visit: Payer: BC Managed Care – PPO | Admitting: Nurse Practitioner

## 2022-12-03 ENCOUNTER — Other Ambulatory Visit: Payer: Self-pay | Admitting: Nurse Practitioner

## 2022-12-03 DIAGNOSIS — R41840 Attention and concentration deficit: Secondary | ICD-10-CM

## 2022-12-03 NOTE — Telephone Encounter (Signed)
Can we call and see if she is interested in continue the medication? If it was helpful? If so can we schedule an office visit

## 2022-12-03 NOTE — Telephone Encounter (Signed)
Per 09/17/22 note  Return in about 4 weeks (around 10/15/2022) for Medication recheck .   Patent has not been seen in office. No app pending.

## 2022-12-06 NOTE — Telephone Encounter (Signed)
Patient called regarding refill request, advised of Matt's message. Patient says she is doing well on this medication and would like to continue. Patient also scheduled for an OV on 11/20 for med f/u, please advise

## 2022-12-07 NOTE — Telephone Encounter (Signed)
Pharmacy called, states that with patient's insurance, they will only cover a 90 day supply of this med if filled at [facility]. Pharmacy wants to know if this could be re sent as a 90 day supply if okay by provider? Please advise

## 2022-12-12 ENCOUNTER — Ambulatory Visit: Payer: BC Managed Care – PPO | Admitting: Nurse Practitioner

## 2022-12-12 VITALS — BP 100/70 | HR 72 | Temp 97.6°F | Ht 62.0 in | Wt 107.8 lb

## 2022-12-12 DIAGNOSIS — R41840 Attention and concentration deficit: Secondary | ICD-10-CM | POA: Diagnosis not present

## 2022-12-12 DIAGNOSIS — F419 Anxiety disorder, unspecified: Secondary | ICD-10-CM | POA: Diagnosis not present

## 2022-12-12 MED ORDER — BUSPIRONE HCL 5 MG PO TABS: 5.0000 mg | ORAL_TABLET | Freq: Two times a day (BID) | ORAL | 1 refills | Status: DC

## 2022-12-12 MED ORDER — BUPROPION HCL ER (XL) 150 MG PO TB24: 150.0000 mg | ORAL_TABLET | Freq: Every day | ORAL | 3 refills | Status: DC

## 2022-12-12 NOTE — Progress Notes (Signed)
Established Patient Office Visit  Subjective   Patient ID: Adriana Smith, female    DOB: 1990/04/02  Age: 32 y.o. MRN: [id number]  Chief Complaint  Patient presents with   Medication Management    Pt is maintaining well on wellbutrin.     HPI  GAD/concentration deficit: Patient was seen by me on 09/17/2022.  Patient was having difficulty closing seeing at work and wondering if she is having more ADD versus anxiety.  Patient was placed on Wellbutrin 150 mg.  Patient also had some symptoms of anxiety.  We had tried her on hydroxyzine but it caused patient to sleep a lot and she felt like she is getting behind in her professional life.  Patient is here for follow-up today. States that she likes it a lot. States that she was not able to get the medication refilled and noticed a difference. States that she is feeling the anxiety and tightness on her chest  States that she does not feel well rested. States that she has had broken sleep where she wakes up. Patient has a history of the same but the difference times she is able to go back to sleep now.    Review of Systems  Constitutional:  Negative for chills and fever.  Respiratory:  Negative for shortness of breath.   Cardiovascular:  Negative for chest pain.  Neurological:  Positive for headaches.  Psychiatric/Behavioral:  Negative for hallucinations and suicidal ideas.       Objective:     BP 100/70   Pulse 72   Temp 97.6 F (36.4 C) (Oral)   Ht 5\' 2"  (1.575 m)   Wt 107 lb 12.8 oz (48.9 kg)   SpO2 99%   BMI 19.72 kg/m  BP Readings from Last 3 Encounters:  12/12/22 100/70  09/17/22 116/68  06/21/22 102/75   Wt Readings from Last 3 Encounters:  12/12/22 107 lb 12.8 oz (48.9 kg)  09/17/22 107 lb (48.5 kg)  06/21/22 107 lb (48.5 kg)   SpO2 Readings from Last 3 Encounters:  12/12/22 99%  09/17/22 98%  06/21/22 98%      Physical Exam Vitals and nursing note reviewed.  Constitutional:      Appearance: Normal  appearance.  Cardiovascular:     Rate and Rhythm: Normal rate and regular rhythm.     Heart sounds: Normal heart sounds.  Pulmonary:     Effort: Pulmonary effort is normal.     Breath sounds: Normal breath sounds.  Neurological:     Mental Status: She is alert.      No results found for any visits on 12/12/22.    The ASCVD Risk score (Arnett DK, et al., 2019) failed to calculate for the following reasons:   The 2019 ASCVD risk score is only valid for ages 6 to 87    Assessment & Plan:   Problem List Items Addressed This Visit       Other   Anxiety - Primary    Will continue Wellbutrin 150 mg daily and on buspirone 5 mg twice daily.  Did discuss the possibility of therapy patient not interested at this current juncture      Relevant Medications   buPROPion (WELLBUTRIN XL) 150 MG 24 hr tablet   busPIRone (BUSPAR) 5 MG tablet   Concentration deficit    Patient is having good result with Wellbutrin 150 mg daily.  Continue      Relevant Medications   buPROPion (WELLBUTRIN XL) 150 MG 24 hr tablet  busPIRone (BUSPAR) 5 MG tablet    Return in about 6 weeks (around 01/23/2023) for GAD/Medication check .    Audria Nine, NP

## 2022-12-12 NOTE — Assessment & Plan Note (Signed)
Will continue Wellbutrin 150 mg daily and on buspirone 5 mg twice daily.  Did discuss the possibility of therapy patient not interested at this current juncture

## 2022-12-12 NOTE — Assessment & Plan Note (Signed)
Patient is having good result with Wellbutrin 150 mg daily.  Continue

## 2022-12-12 NOTE — Patient Instructions (Signed)
Nice to see you today I have refilled the wellbutrin and sent in buspar Follow up with me in person or virtually in 6 weeks, sooner if you need me

## 2022-12-31 NOTE — Progress Notes (Unsigned)
NEUROLOGY FOLLOW UP OFFICE NOTE  Adriana Smith [id number]  Assessment/Plan:   Persistent right temporal headache - tension-type headache vs TMJ dysfunction.  ***   Subjective:  Adriana Smith is a 32 year old female with thyroid disease, depression and anxiety who follows up for migraines.  UPDATE: MRI of brain with and without contrast on 08/03/2022 personally reviewed was normal.  Started baclofen *** Dentist or oral surgeon ***  Intensity:  *** Duration:  *** Frequency:  *** Frequency of abortive medication: *** Current NSAIDS/analgesics:  Excedrin, ibuprofen Current triptans:  none Current ergotamine:  none Current anti-emetic:  none Current muscle relaxants:  baclofen 5mg  TID Current Antihypertensive medications:  none Current Antidepressant medications:  none Current Anticonvulsant medications:  none Current anti-CGRP:  none Current Vitamins/Herbal/Supplements:  D Current Antihistamines/Decongestants:  none Other therapy:  none Birth control:  Kyleena Other medications:  hydroxyzine   Caffeine:  2-3 cups coffee in morning Diet:  4-5 bottles of water daily.  Skips breakfast. Exercise:  walks Depression:  stable; Anxiety:  mild.  Sometimes she notes difficulty catching her breath or chest pressure.   Other pain:  no Sleep hygiene:  OK.  Sleeps uninterrupted.    HISTORY: Onset:  since her late-20s.  Worse this year.  They used to occur 1 to 2 times a week.  Now daily/persistent, progressively gotten worse Location:  initially mid-frontal.  For past 2 months, starts in right temporal/TMJ region and radiates up to the left temporal region.  Right temple tender to touch. Quality:  aching/bruised feeling.  Sometimes shooting pain from the right temple to the left temple.  Feels like she needs to open mouth to release pressure from the right ear. Intensity:  7/10.   Associated symptoms:  Sometimes squiggly lines off and on.  She denies associated nausea,  vomiting, photophobia, phonophobia, unilateral numbness or weakness. Duration:  persistent Frequency:  persistent Triggers/aggravating factors:  chewing may slightly aggravate Relieving factors:  ibuprofen or Excedrin Activity:  able to function  Past NSAIDS/analgesics:  acetaminophen Past abortive triptans:  none Past abortive ergotamine:  none Past muscle relaxants:  none Past anti-emetic:  none Past antihypertensive medications:  none Past antidepressant medications:  none Past anticonvulsant medications:  none Past anti-CGRP:  none Past vitamins/Herbal/Supplements:  none Past antihistamines/decongestants:  none Other past therapies:  none   Sometimes she may clench her teeth.  Not aware of grinding her teeth at night.  Family history of headache:  mother  PAST MEDICAL HISTORY: Past Medical History:  Diagnosis Date   Anxiety    Chronic headaches    Depression    Thyroid disease     MEDICATIONS: Current Outpatient Medications on File Prior to Visit  Medication Sig Dispense Refill   Baclofen 5 MG TABS Take 1 tablet (5 mg total) by mouth 3 (three) times daily. 90 tablet 5   buPROPion (WELLBUTRIN XL) 150 MG 24 hr tablet Take 1 tablet (150 mg total) by mouth daily. 90 tablet 3   busPIRone (BUSPAR) 5 MG tablet Take 1 tablet (5 mg total) by mouth 2 (two) times daily. 60 tablet 1   fluticasone (FLONASE) 50 MCG/ACT nasal spray Place 2 sprays into both nostrils daily. 16 g 0   hydrOXYzine (ATARAX) 10 MG tablet Take 1 tablet (10 mg total) by mouth at bedtime as needed. (Patient not taking: Reported on 09/17/2022) 30 tablet 2   Vitamin D, Ergocalciferol, (DRISDOL) 1.25 MG (50000 UNIT) CAPS capsule Take 1 capsule (50,000 Units total) by  mouth every 7 (seven) days. 12 capsule 0   No current facility-administered medications on file prior to visit.    ALLERGIES: Allergies  Allergen Reactions   Latex Hives    Only when she goes to the dentist.    FAMILY HISTORY: Family History   Problem Relation Age of Onset   Hypertension Father    Congestive Heart Failure Maternal Grandmother    Diabetes Maternal Grandfather    Stroke Maternal Grandfather    Diabetes Paternal Grandmother       Objective:  *** General: No acute distress.  Patient appears well-groomed.   Head:  Normocephalic/atraumatic Eyes:  Fundi examined but not visualized Neck: supple, no paraspinal tenderness, full range of motion Heart:  Regular rate and rhythm Neurological Exam: ***   Shon Millet, DO  CC: Mordecai Maes, NP

## 2023-01-01 ENCOUNTER — Ambulatory Visit: Payer: BC Managed Care – PPO | Admitting: Neurology

## 2023-01-28 ENCOUNTER — Ambulatory Visit: Payer: BC Managed Care – PPO | Admitting: Nurse Practitioner

## 2023-02-13 ENCOUNTER — Ambulatory Visit: Payer: BC Managed Care – PPO | Admitting: Nurse Practitioner

## 2023-03-12 DIAGNOSIS — Z3189 Encounter for other procreative management: Secondary | ICD-10-CM | POA: Diagnosis not present

## 2023-03-12 DIAGNOSIS — Z30432 Encounter for removal of intrauterine contraceptive device: Secondary | ICD-10-CM | POA: Diagnosis not present

## 2023-03-12 DIAGNOSIS — Z319 Encounter for procreative management, unspecified: Secondary | ICD-10-CM | POA: Diagnosis not present

## 2023-04-05 DIAGNOSIS — R519 Headache, unspecified: Secondary | ICD-10-CM | POA: Diagnosis not present

## 2023-04-05 DIAGNOSIS — H43813 Vitreous degeneration, bilateral: Secondary | ICD-10-CM | POA: Diagnosis not present

## 2023-05-06 ENCOUNTER — Ambulatory Visit: Payer: Self-pay

## 2023-05-06 NOTE — Telephone Encounter (Signed)
 noted

## 2023-05-06 NOTE — Telephone Encounter (Signed)
 Copied from CRM [id number]. Topic: Clinical - Red Word Triage >> May 06, 2023 10:43 AM Truddie Crumble wrote: Red Word that prompted transfer to Nurse Triage: patient called stating she has pain in her chest going to the crease of her right arm and patient stated it is the same side she has muscle spasms in her back.patient also stated she has slight breathing. Patient stated it has been going on for a week and it started off as a pain in her right side of her chest   Chief Complaint: Right Sided Chest Pain  Symptoms: Dyspnea, Jaw Pain, Headaches  Frequency: One Week  Pertinent Negatives: Patient denies dizziness, nausea,   Disposition: [x] ED /[] Urgent Care (no appt availability in office) / [] Appointment(In office/virtual)/ []  [facility]/ [] Home Care/ [x] Refused Recommended Disposition /[] [facility]/ []  Follow-up with PCP  Additional Notes: CK is being triaged for right sided chest pain that has been constant for one week with dyspnea, jaw pain, and headaches. The patient confirms a family history on both sides of cardiac issues. The patient has also had elevated cholesterol levels in the past. Based on the presentation, recommended the patient seek Emergency Care for prompt evaluation and treatment. The patient preferred to make an appointment but understood the recommended disposition.   Reason for Disposition  Pain also in shoulder(s) or arm(s) or jaw  (Exception: Pain is clearly made worse by movement.)  Answer Assessment - Initial Assessment Questions 1. LOCATION: "Where does it hurt?"       Right Side of Chest  2. RADIATION: "Does the pain go anywhere else?" (e.g., into neck, jaw, arms, back)     Right Axillary Area  3. ONSET: "When did the chest pain begin?" (Minutes, hours or days)      One week ago  4. PATTERN: "Does the pain come and go, or has it been constant since it started?"  "Does it get worse with exertion?"      Constant  5. DURATION: "How long does  it last" (e.g., seconds, minutes, hours)     Constant  6. SEVERITY: "How bad is the pain?"  (e.g., Scale 1-10; mild, moderate, or severe)    - MILD (1-3): doesn't interfere with normal activities     - MODERATE (4-7): interferes with normal activities or awakens from sleep    - SEVERE (8-10): excruciating pain, unable to do any normal activities       7  7. CARDIAC RISK FACTORS: "Do you have any history of heart problems or risk factors for heart disease?" (e.g., angina, prior heart attack; diabetes, high blood pressure, high cholesterol, smoker, or strong family history of heart disease)      High Cholesterol Family History of Cardiac Risk Factors  8. PULMONARY RISK FACTORS: "Do you have any history of lung disease?"  (e.g., blood clots in lung, asthma, emphysema, birth control pills)     No   9. CAUSE: "What do you think is causing the chest pain?"     Unsure  10. OTHER SYMPTOMS: "Do you have any other symptoms?" (e.g., dizziness, nausea, vomiting, sweating, fever, difficulty breathing, cough)       Nothing  11. PREGNANCY: "Is there any chance you are pregnant?" "When was your last menstrual period?"       No, LMP 1st of this month  Protocols used: Chest Pain-A-AH

## 2023-05-07 ENCOUNTER — Ambulatory Visit: Admitting: Nurse Practitioner

## 2023-05-07 ENCOUNTER — Ambulatory Visit (INDEPENDENT_AMBULATORY_CARE_PROVIDER_SITE_OTHER)
Admission: RE | Admit: 2023-05-07 | Discharge: 2023-05-07 | Disposition: A | Discharge status: Home / Self Care | Source: Ambulatory Visit | Attending: Nurse Practitioner | Admitting: Nurse Practitioner

## 2023-05-07 VITALS — BP 100/70 | HR 69 | Temp 98.0°F | Ht 62.0 in | Wt 105.8 lb

## 2023-05-07 DIAGNOSIS — Z3200 Encounter for pregnancy test, result unknown: Secondary | ICD-10-CM | POA: Insufficient documentation

## 2023-05-07 DIAGNOSIS — R0789 Other chest pain: Secondary | ICD-10-CM | POA: Insufficient documentation

## 2023-05-07 DIAGNOSIS — R071 Chest pain on breathing: Secondary | ICD-10-CM | POA: Diagnosis not present

## 2023-05-07 LAB — COMPREHENSIVE METABOLIC PANEL WITH GFR
ALT: 15 U/L (ref 0–35)
AST: 17 U/L (ref 0–37)
Albumin: 5 g/dL (ref 3.5–5.2)
Alkaline Phosphatase: 28 U/L — ABNORMAL LOW (ref 39–117)
BUN: 9 mg/dL (ref 6–23)
CO2: 28 meq/L (ref 19–32)
Calcium: 8.9 mg/dL (ref 8.4–10.5)
Chloride: 103 meq/L (ref 96–112)
Creatinine, Ser: 0.67 mg/dL (ref 0.40–1.20)
GFR: 114.27 mL/min (ref >60.00)
Glucose, Bld: 91 mg/dL (ref 70–99)
Potassium: 3.9 meq/L (ref 3.5–5.1)
Sodium: 138 meq/L (ref 135–145)
Total Bilirubin: 0.5 mg/dL (ref 0.2–1.2)
Total Protein: 6.9 g/dL (ref 6.0–8.3)

## 2023-05-07 LAB — CBC
HCT: 40.2 % (ref 36.0–46.0)
Hemoglobin: 13.9 g/dL (ref 12.0–15.0)
MCHC: 34.6 g/dL (ref 30.0–36.0)
MCV: 90.4 fl (ref 78.0–100.0)
Platelets: 309 10*3/uL (ref 150.0–400.0)
RBC: 4.45 Mil/uL (ref 3.87–5.11)
RDW: 13.1 % (ref 11.5–15.5)
WBC: 4.3 10*3/uL (ref 4.0–10.5)

## 2023-05-07 LAB — SEDIMENTATION RATE: Sed Rate: <1 mm/h (ref 0–20)

## 2023-05-07 LAB — HIGH SENSITIVITY CRP: CRP, High Sensitivity: 0.43 mg/L (ref 0.000–5.000)

## 2023-05-07 LAB — POCT URINE PREGNANCY: Preg Test, Ur: NEGATIVE

## 2023-05-07 NOTE — Patient Instructions (Signed)
 Nice to see you today I will be in touch with the labs and xray once I have them Follow up if you do not improve

## 2023-05-07 NOTE — Assessment & Plan Note (Signed)
 Urine pregnancy in office.  This was negative

## 2023-05-07 NOTE — Assessment & Plan Note (Signed)
 Atypical chest pain in office.  Will check basic labs inclusive of CBC, CMP, D-dimer, ESR, sed rate.  Also pending chest x-ray.  Pregnancy test was negative in office.  This was reproducible in office likely [facility] if testing is negative consider oral anti-inflammatory

## 2023-05-07 NOTE — Progress Notes (Signed)
 Acute Office Visit  Subjective:     Patient ID: Adriana Smith, female    DOB: 03-03-1990, 32 y.o.   MRN: [id number]  Chief Complaint  Patient presents with   Chest Pain    Pt complains of right sided chest pain that started about a week ago. Pain radiating to inner armpit area and upper right side of back. States of constant achy pain. Pt states she notices herself taking deeper breaths.     HPI Patient is in today for chest pain with DOE with a history of anxiety, concentration deficit  According to triage patient was having right sided chest pain with dyspena that has been constant for a week. Patient does have a family history of cardiac issues. She is here today for evaluation.  States that she is taking deep breathes to try and figure something out but not short of breath. The chest discomfort is on the right side of the chest sometimes above or behind the breast that will radiate to her armpit. States that palpation can make it some worse. State that it is constant that is dull aching. Eating, drinking, laying flat or walking does not make it better or worse    Patient has no personal or family history of clotting disorders that she is aware of.  Not currently on any exogenous hormones inclusive of birth control.  Patient has never had a blood clot in the past.  States approximately 2 weeks ago she did travel for work to [place] but the flight was over 2 hours each way.  She denies any recent sickness in the past 2 weeks  Review of Systems  Constitutional:  Negative for chills and fever.  Respiratory:  Negative for shortness of breath.   Cardiovascular:  Positive for chest pain.  Gastrointestinal:  Negative for abdominal pain, constipation, diarrhea, nausea and vomiting.  Neurological:  Negative for dizziness and headaches.  Psychiatric/Behavioral:  Negative for hallucinations and suicidal ideas.         Objective:    BP 100/70   Pulse 69   Temp 98 F (36.7 C)  (Oral)   Ht 5\' 2"  (1.575 m)   Wt 105 lb 12.8 oz (48 kg)   LMP 04/23/2023 (Exact Date)   SpO2 99%   BMI 19.35 kg/m  BP Readings from Last 3 Encounters:  05/07/23 100/70  12/12/22 100/70  09/17/22 116/68   Wt Readings from Last 3 Encounters:  05/07/23 105 lb 12.8 oz (48 kg)  12/12/22 107 lb 12.8 oz (48.9 kg)  09/17/22 107 lb (48.5 kg)   SpO2 Readings from Last 3 Encounters:  05/07/23 99%  12/12/22 99%  09/17/22 98%      Physical Exam Vitals and nursing note reviewed.  Constitutional:      Appearance: Normal appearance.  Cardiovascular:     Rate and Rhythm: Normal rate and regular rhythm.     Heart sounds: Normal heart sounds.  Pulmonary:     Effort: Pulmonary effort is normal.     Breath sounds: Normal breath sounds.  Chest:     Chest wall: Tenderness present.    Abdominal:     General: Bowel sounds are normal. There is no distension.     Palpations: There is no mass.     Tenderness: There is abdominal tenderness in the epigastric area.     Hernia: No hernia is present.  Neurological:     Mental Status: She is alert.     Results for orders placed  or performed in visit on 05/07/23  POCT urine pregnancy  Result Value Ref Range   Preg Test, Ur Negative Negative        Assessment & Plan:   Problem List Items Addressed This Visit       Other   Atypical chest pain - Primary   Atypical chest pain in office.  Will check basic labs inclusive of CBC, CMP, D-dimer, ESR, sed rate.  Also pending chest x-ray.  Pregnancy test was negative in office.  This was reproducible in office likely [facility] if testing is negative consider oral anti-inflammatory      Relevant Orders   DG Chest 2 View (Completed)   CBC   Comprehensive metabolic panel with GFR   D-dimer, quantitative   High sensitivity CRP   Sedimentation rate   Possible pregnancy   Urine pregnancy in office.  This was negative      Relevant Orders   POCT urine pregnancy (Completed)    No orders of the  defined types were placed in this encounter.   Return if symptoms worsen or fail to improve.  Margarie Shay, NP

## 2023-05-08 ENCOUNTER — Encounter: Payer: Self-pay | Admitting: Nurse Practitioner

## 2023-05-08 ENCOUNTER — Other Ambulatory Visit: Payer: Self-pay | Admitting: Nurse Practitioner

## 2023-05-08 DIAGNOSIS — R0789 Other chest pain: Secondary | ICD-10-CM

## 2023-05-08 LAB — D-DIMER, QUANTITATIVE: D-Dimer, Quant: <0.19 ug{FEU}/mL (ref <0.50)

## 2023-05-08 MED ORDER — MELOXICAM 15 MG PO TABS: 15.0000 mg | ORAL_TABLET | Freq: Every day | ORAL | 0 refills | Status: DC

## 2023-06-13 ENCOUNTER — Other Ambulatory Visit: Payer: Self-pay | Admitting: Nurse Practitioner

## 2023-06-13 DIAGNOSIS — R41840 Attention and concentration deficit: Secondary | ICD-10-CM

## 2023-06-13 DIAGNOSIS — R0789 Other chest pain: Secondary | ICD-10-CM

## 2023-06-13 DIAGNOSIS — F419 Anxiety disorder, unspecified: Secondary | ICD-10-CM

## 2023-07-16 DIAGNOSIS — Z124 Encounter for screening for malignant neoplasm of cervix: Secondary | ICD-10-CM | POA: Diagnosis not present

## 2023-07-16 DIAGNOSIS — Z01419 Encounter for gynecological examination (general) (routine) without abnormal findings: Secondary | ICD-10-CM | POA: Diagnosis not present

## 2023-08-12 ENCOUNTER — Ambulatory Visit: Payer: Self-pay

## 2023-08-12 NOTE — Telephone Encounter (Signed)
 Noted. Will eval in office

## 2023-08-12 NOTE — Telephone Encounter (Signed)
 FYI Only or Action Required?: FYI only for provider.  Patient was last seen in primary care on 05/07/2023 by Wendee Lynwood HERO, NP.  Called Nurse Triage reporting Otalgia. - LGF, sinus congestion  Symptoms began today.  Interventions attempted: Nothing.  Symptoms are: gradually worsening.  Triage Disposition: No disposition on file.  Patient/caregiver understands and will follow disposition?: yes                  Copied from CRM [id number]. Topic: Clinical - Red Word Triage >> Aug 12, 2023  3:19 PM Adriana Smith wrote: Red Word that prompted transfer to Nurse Triage: Patient has been experiencing a low Smith fever since Saturday,severe ear pain has developed today, patient also has a cough and congestion. Answer Assessment - Initial Assessment Questions 1. LOCATION: Which ear is involved?     right 2. ONSET: When did the ear pain start?      today 3. SEVERITY: How bad is the pain?  (Scale 1-10; mild, moderate or severe)     7/10 4. URI SYMPTOMS: Do you have a runny nose or cough?     Cough - congestion 5. FEVER: Do you have a fever? If Yes, ask: What is your temperature, how was it measured, and when did it start?     Yes - 99.8 6. CAUSE: Have you been swimming recently?, How often do you use Q-TIPS?, Have you had any recent air travel or scuba diving?     no 7. OTHER SYMPTOMS: Do you have any other symptoms? (e.g., decreased hearing, dizziness, headache, stiff neck, vomiting)     Cough congestion - blood this morning  Protocols used: Earache-A-AH

## 2023-08-13 ENCOUNTER — Ambulatory Visit: Admitting: Nurse Practitioner

## 2023-08-13 VITALS — BP 110/70 | HR 102 | Temp 99.0°F | Ht 62.0 in | Wt 105.0 lb

## 2023-08-13 DIAGNOSIS — H9201 Otalgia, right ear: Secondary | ICD-10-CM

## 2023-08-13 DIAGNOSIS — H6501 Acute serous otitis media, right ear: Secondary | ICD-10-CM

## 2023-08-13 DIAGNOSIS — H6121 Impacted cerumen, right ear: Secondary | ICD-10-CM

## 2023-08-13 DIAGNOSIS — J029 Acute pharyngitis, unspecified: Secondary | ICD-10-CM

## 2023-08-13 DIAGNOSIS — R051 Acute cough: Secondary | ICD-10-CM

## 2023-08-13 DIAGNOSIS — U071 COVID-19: Secondary | ICD-10-CM | POA: Diagnosis not present

## 2023-08-13 DIAGNOSIS — R509 Fever, unspecified: Secondary | ICD-10-CM | POA: Diagnosis not present

## 2023-08-13 DIAGNOSIS — R52 Pain, unspecified: Secondary | ICD-10-CM | POA: Insufficient documentation

## 2023-08-13 DIAGNOSIS — M791 Myalgia, unspecified site: Secondary | ICD-10-CM | POA: Diagnosis not present

## 2023-08-13 LAB — POC COVID19 BINAXNOW: SARS Coronavirus 2 Ag: POSITIVE — AB

## 2023-08-13 LAB — POCT FLU A/B STATUS
Influenza A, POC: NEGATIVE
Influenza B, POC: NEGATIVE

## 2023-08-13 LAB — POCT RAPID STREP A (OFFICE): Rapid Strep A Screen: NEGATIVE

## 2023-08-13 MED ORDER — AMOXICILLIN-POT CLAVULANATE 875-125 MG PO TABS: 1.0000 | ORAL_TABLET | Freq: Two times a day (BID) | ORAL | 0 refills | Status: AC

## 2023-08-13 NOTE — Assessment & Plan Note (Signed)
 COVID, flu, strep test in office.  Patient is warm salt water gargles and over-the-counter analgesics as needed

## 2023-08-13 NOTE — Assessment & Plan Note (Signed)
 Flu and COVID test in office.  Over-the-counter analgesics as needed.  Rest drink plenty fluids.

## 2023-08-13 NOTE — Assessment & Plan Note (Signed)
Flu and COVID test in office.  Over-the-counter analgesics as needed

## 2023-08-13 NOTE — Assessment & Plan Note (Signed)
 Verbal consent obtained.  Patient was prepped proximal positive water was used and right ear irrigated.  Patient tolerated procedure well.  Impaction removed

## 2023-08-13 NOTE — Assessment & Plan Note (Signed)
 COVID test in office.

## 2023-08-13 NOTE — Assessment & Plan Note (Signed)
 Do Augmentin  875-125 mg twice daily for 7 days.  Ibuprofen/Tylenol as needed for pain

## 2023-08-13 NOTE — Patient Instructions (Signed)
 Nice to see you today You tested positive for covid and have an ear infection Take the prescription as directed You can use tylenol/ibuprofen as directed You can use warm salt water gargles as needed No work until you are fever free without the use of fever reducing medications

## 2023-08-13 NOTE — Assessment & Plan Note (Signed)
 COVID is positive.  We did discuss antiviral treatment.  During discussion and decision to defer treatment at this point

## 2023-08-13 NOTE — Progress Notes (Signed)
 Acute Office Visit  Subjective:     Patient ID: Adriana Smith, female    DOB: 10-14-1990, 33 y.o.   MRN: [id number]  Chief Complaint  Patient presents with   Ear Pain    Pt complains of L ear pain. Pt states of body aches on Saturday, low grade fever, cough. Pt states ear started to hurt yesterday and now she has no hearing in L ear today. Slight sore throat. Pt also mentions slight diarrhea with upset stomach     HPI Patient is in today for sick symptoms with a history that is no contributory  Sympotms started on Saturday. No sick contacts States that her boss left early on Thursday and was not at work Friday but not direct contact   Covid vaccine: none Flu vaccine: out of season   States that she has been using dayquill and advil. Helps some.   Review of Systems  Constitutional:  Positive for chills, fever and malaise/fatigue.  HENT:  Positive for ear pain and sore throat (achy). Negative for ear discharge.   Respiratory:  Positive for cough. Negative for sputum production and shortness of breath.   Cardiovascular:  Negative for chest pain.  Gastrointestinal:  Positive for diarrhea. Negative for abdominal pain, nausea and vomiting.  Musculoskeletal:  Positive for myalgias.  Neurological:  Positive for headaches.        Objective:    BP 110/70   Pulse (!) 102   Temp 99 F (37.2 C) (Oral)   Ht 5' 2 (1.575 m)   Wt 105 lb (47.6 kg)   SpO2 97%   BMI 19.20 kg/m    Physical Exam Vitals and nursing note reviewed.  Constitutional:      Appearance: Normal appearance.  HENT:     Right Ear: Ear canal and external ear normal. There is impacted cerumen. Tympanic membrane is erythematous and bulging.     Left Ear: Tympanic membrane, ear canal and external ear normal.     Mouth/Throat:     Mouth: Mucous membranes are moist.     Pharynx: Oropharynx is clear. No posterior oropharyngeal erythema.  Cardiovascular:     Rate and Rhythm: Normal rate and regular rhythm.      Heart sounds: Normal heart sounds.  Pulmonary:     Effort: Pulmonary effort is normal.     Breath sounds: Normal breath sounds.  Lymphadenopathy:     Cervical: Cervical adenopathy present.  Neurological:     Mental Status: She is alert.     Results for orders placed or performed in visit on 08/13/23  POC COVID-19 BinaxNow  Result Value Ref Range   SARS Coronavirus 2 Ag Positive (A) Negative  Rapid Strep A  Result Value Ref Range   Rapid Strep A Screen Negative Negative  POCT Flu A & B Status  Result Value Ref Range   Influenza A, POC Negative Negative   Influenza B, POC Negative Negative        Assessment & Plan:   Problem List Items Addressed This Visit       Nervous and Auditory   Right acute serous otitis media   Do Augmentin  875-125 mg twice daily for 7 days.  Ibuprofen/Tylenol as needed for pain      Relevant Medications   amoxicillin -clavulanate (AUGMENTIN ) 875-125 MG tablet   Impacted cerumen of right ear   Verbal consent obtained.  Patient was prepped proximal positive water was used and right ear irrigated.  Patient tolerated procedure well.  Impaction removed      Relevant Orders   Ear Lavage     Other   COVID-19 - Primary   COVID is positive.  We did discuss antiviral treatment.  During discussion and decision to defer treatment at this point      Acute cough   COVID test in office      Relevant Orders   POC COVID-19 BinaxNow (Completed)   POCT Flu A & B Status (Completed)   Fever and chills   Flu and COVID test in office.  Over-the-counter analgesics as needed.  Rest drink plenty fluids.      Relevant Orders   POC COVID-19 BinaxNow (Completed)   POCT Flu A & B Status (Completed)   Body aches   Flu and COVID test in office.  Over-the-counter analgesics as needed      Relevant Orders   POC COVID-19 BinaxNow (Completed)   POCT Flu A & B Status (Completed)   Sore throat   COVID, flu, strep test in office.  Patient is warm salt water  gargles and over-the-counter analgesics as needed      Relevant Orders   POC COVID-19 BinaxNow (Completed)   Rapid Strep A (Completed)   POCT Flu A & B Status (Completed)   Other Visit Diagnoses       Right ear pain           Meds ordered this encounter  Medications   amoxicillin -clavulanate (AUGMENTIN ) 875-125 MG tablet    Sig: Take 1 tablet by mouth 2 (two) times daily for 7 days.    Dispense:  14 tablet    Refill:  0    Supervising Provider:   RANDEEN HARDY A [[id number]]    Return if symptoms worsen or fail to improve.  Adriana Crandall, NP

## 2023-09-04 ENCOUNTER — Ambulatory Visit: Admitting: Nurse Practitioner

## 2023-09-04 NOTE — Progress Notes (Deleted)
   Established Patient Office Visit  Subjective   Patient ID: Adriana Smith, female    DOB: 04/28/1990  Age: 33 y.o. MRN: [id number]  No chief complaint on file.   HPI  Anxiety: Patient currently maintained on Wellbutrin 150 mg daily and BuSpar 5 mg twice daily.  Patient hide hydroxyzine 10 mg 3 times daily as needed  {History (Optional):23778}  ROS    Objective:     There were no vitals taken for this visit. {Vitals History (Optional):23777}  Physical Exam   No results found for any visits on 09/04/23.  {Labs (Optional):23779}  The ASCVD Risk score (Arnett DK, et al., 2019) failed to calculate for the following reasons:   The 2019 ASCVD risk score is only valid for ages 65 to 37    Assessment & Plan:   Problem List Items Addressed This Visit   None   No follow-ups on file.    [name], NP

## 2023-11-29 ENCOUNTER — Other Ambulatory Visit: Payer: Self-pay | Admitting: Nurse Practitioner

## 2023-11-29 DIAGNOSIS — R41840 Attention and concentration deficit: Secondary | ICD-10-CM

## 2023-11-29 DIAGNOSIS — F419 Anxiety disorder, unspecified: Secondary | ICD-10-CM

## 2023-12-27 ENCOUNTER — Other Ambulatory Visit: Payer: Self-pay | Admitting: Nurse Practitioner

## 2023-12-27 DIAGNOSIS — F419 Anxiety disorder, unspecified: Secondary | ICD-10-CM

## 2023-12-27 DIAGNOSIS — R41840 Attention and concentration deficit: Secondary | ICD-10-CM

## 2024-02-10 ENCOUNTER — Other Ambulatory Visit: Payer: Self-pay | Admitting: Nurse Practitioner

## 2024-02-10 DIAGNOSIS — R41840 Attention and concentration deficit: Secondary | ICD-10-CM

## 2024-02-11 NOTE — Telephone Encounter (Signed)
 Needs CPE scheduled for 05/07/2024 or slightly after.

## 2024-02-18 NOTE — Telephone Encounter (Signed)
 called pt and schedule appt

## 2024-02-19 NOTE — Telephone Encounter (Signed)
 Noted.

## 2024-05-15 ENCOUNTER — Encounter: Admitting: Nurse Practitioner
